# Patient Record
Sex: Female | Born: 1955 | Race: White | Hispanic: No | Marital: Married | Smoking: Never smoker
Health system: Southern US, Community
[De-identification: ages and names within clinical notes are randomized; demographics above are authoritative.]

## PROBLEM LIST (undated history)

## (undated) DIAGNOSIS — Z87442 Personal history of urinary calculi: Secondary | ICD-10-CM

## (undated) DIAGNOSIS — C9110 Chronic lymphocytic leukemia of B-cell type not having achieved remission: Secondary | ICD-10-CM

## (undated) DIAGNOSIS — D649 Anemia, unspecified: Secondary | ICD-10-CM

## (undated) DIAGNOSIS — I83893 Varicose veins of bilateral lower extremities with other complications: Secondary | ICD-10-CM

## (undated) DIAGNOSIS — M1990 Unspecified osteoarthritis, unspecified site: Secondary | ICD-10-CM

## (undated) DIAGNOSIS — N951 Menopausal and female climacteric states: Secondary | ICD-10-CM

## (undated) DIAGNOSIS — E119 Type 2 diabetes mellitus without complications: Secondary | ICD-10-CM

## (undated) DIAGNOSIS — E785 Hyperlipidemia, unspecified: Secondary | ICD-10-CM

## (undated) DIAGNOSIS — K219 Gastro-esophageal reflux disease without esophagitis: Secondary | ICD-10-CM

## (undated) DIAGNOSIS — R0789 Other chest pain: Secondary | ICD-10-CM

## (undated) DIAGNOSIS — I872 Venous insufficiency (chronic) (peripheral): Secondary | ICD-10-CM

## (undated) DIAGNOSIS — E079 Disorder of thyroid, unspecified: Secondary | ICD-10-CM

## (undated) DIAGNOSIS — Z9109 Other allergy status, other than to drugs and biological substances: Secondary | ICD-10-CM

## (undated) DIAGNOSIS — E669 Obesity, unspecified: Secondary | ICD-10-CM

## (undated) DIAGNOSIS — I10 Essential (primary) hypertension: Secondary | ICD-10-CM

## (undated) DIAGNOSIS — E063 Autoimmune thyroiditis: Secondary | ICD-10-CM

## (undated) DIAGNOSIS — E7800 Pure hypercholesterolemia, unspecified: Secondary | ICD-10-CM

## (undated) DIAGNOSIS — F419 Anxiety disorder, unspecified: Secondary | ICD-10-CM

## (undated) DIAGNOSIS — E039 Hypothyroidism, unspecified: Secondary | ICD-10-CM

## (undated) HISTORY — DX: Venous insufficiency (chronic) (peripheral): I87.2

## (undated) HISTORY — PX: DILATION AND CURETTAGE OF UTERUS: SHX78

## (undated) HISTORY — DX: Menopausal and female climacteric states: N95.1

## (undated) HISTORY — DX: Pure hypercholesterolemia, unspecified: E78.00

## (undated) HISTORY — DX: Hyperlipidemia, unspecified: E78.5

## (undated) HISTORY — DX: Varicose veins of bilateral lower extremities with other complications: I83.893

## (undated) HISTORY — DX: Disorder of thyroid, unspecified: E07.9

## (undated) HISTORY — DX: Chronic lymphocytic leukemia of B-cell type not having achieved remission: C91.10

## (undated) HISTORY — DX: Other allergy status, other than to drugs and biological substances: Z91.09

## (undated) HISTORY — DX: Autoimmune thyroiditis: E06.3

## (undated) HISTORY — DX: Obesity, unspecified: E66.9

## (undated) HISTORY — DX: Other chest pain: R07.89

---

## 1987-02-02 HISTORY — PX: VEIN LIGATION AND STRIPPING: SHX2653

## 1999-11-18 ENCOUNTER — Emergency Department (HOSPITAL_COMMUNITY): Admission: EM | Admit: 1999-11-18 | Discharge: 1999-11-18 | Payer: Self-pay | Admitting: Internal Medicine

## 2009-04-01 DIAGNOSIS — R0789 Other chest pain: Secondary | ICD-10-CM

## 2009-04-01 HISTORY — DX: Other chest pain: R07.89

## 2009-04-11 ENCOUNTER — Ambulatory Visit: Payer: Self-pay | Admitting: Cardiovascular Disease

## 2009-04-22 ENCOUNTER — Telehealth: Payer: Self-pay | Admitting: Cardiovascular Disease

## 2010-03-03 NOTE — Progress Notes (Signed)
  Phone Note Outgoing Call   Call placed by: Dessie Coma LPN Call placed to: Patient Summary of Call: LMVM-notified patient per Dr. Freida Busman, to f/u in 6-12 months unless chest pain reoccurs.  To call office in 6 months to make a f/u appt.

## 2010-04-15 ENCOUNTER — Encounter: Payer: Self-pay | Admitting: Cardiovascular Disease

## 2010-06-16 NOTE — Assessment & Plan Note (Signed)
[facility]                        [facility] OFFICE NOTE   NAME:Michele King, Michele King                            MRN:          [id number]  DATE:04/11/2009                            DOB:          Feb 27, 1955    CHIEF COMPLAINT:  Chest discomfort.   HISTORY OF PRESENT ILLNESS:  Michele King is a 55 year old white female past  medical history significant for hypothyroidism, hyperlipidemia who is  presenting with an episode of chest discomfort.  The patient states that  last night while at rest, she experienced acute onset of a substernal  chest tightness associated with some nausea lasting approximately 15  minutes and resolving.  This discomfort has not reoccurred.  The patient  states that for the past 6 months, she has had intermittent fleeting,  sharp right-sided chest discomfort not related to activity without  radiation or associated symptoms.  She has had that on and off during  the day today.  She states that discomfort tends to be reproducible by  palpation.  She is up-to-date with her mammography.  She denies any  dyspnea on exertion, lower extremity edema, PND, orthopnea, dizziness or  syncope.  She reported to Urgent Care this morning where per her report  chest x-ray was normal and EKG showed nonspecific T-wave abnormalities  and troponin was 0.01, and a CK was 86.   PAST MEDICAL HISTORY:  As above in HPI.  In addition, the patient has  reflux.   SOCIAL HISTORY:  No tobacco, no alcohol.   FAMILY HISTORY:  The patient states her father had a small heart  attack when he was around her age.  However, he did not have any  revascularization.   ALLERGIES:  No known drug allergies.   MEDICATIONS:  1. Simvastatin 20 mg daily.  2. Synthroid 0.125 mg daily.  3. Aciphex daily.   REVIEW OF SYSTEMS:  As in HPI.  The patient also endorses increased  stressors recently.  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  Other systems  are negative.   PHYSICAL EXAMINATION:  VITAL SIGNS:  Blood pressure is 118/66, pulse is  73, satting 96% on room air, and she weighs 197 pounds.  GENERAL:  No acute distress.  HEENT:  Normocephalic, atraumatic.  NECK:  Supple.  No carotid bruit.  No JVD.  HEART:  Regular rate and rhythm without murmur, rub or gallop.  LUNGS:  Clear bilaterally.  ABDOMEN:  Soft, nontender, nondistended.  EXTREMITIES:  Without edema.  SKIN:  Warm and dry.  NEURO:  Nonfocal.  MUSCULOSKELETAL:  The patient has tenderness to palpation over that  right aspect of the breast where the chest pain has been occurring over  the past 6 months.  PSYCHIATRIC:  The patient is appropriate with normal levels of insight.   I reviewed the patient's labs as above in HPI.  I reviewed the patient's  EKG.  The patient is normal sinus rhythm with some nonspecific T-wave  flattening, V2 and V3, otherwise within normal limits.   ASSESSMENT:  A 55 year old female with hyperlipidemia who is presenting  with 2 separate types of chest discomfort.  The chest discomfort she has  been experiencing over the past 6 months is likely musculoskeletal in  origin as it is reproducible and very atypical in nature.  The chest  tightness that she describes yesterday evening is more concerning for  angina.  It has not reoccurred.  We asked her she begin therapy with  aspirin 162 mg daily and she will be given a prescription for sublingual  nitroglycerin.  Over the weekend, the patient is told that if she has  repeated episodes of this chest discomfort especially if they are  frequent or not promptly relieved with NRT, she should report to the  emergency room.  We have scheduled an exercise Cardiolite for next  Wednesday as the patient states she has prior engagements through work  on Monday and Tuesday.  The patient is told that if the chest pain does  not reoccur between now and Wednesday that  is fine for her to wait until  then for the stress test.  If she does have recurrences in earlier  ischemia, evaluation will be needed.     Brayton El, MD  Electronically Signed    SGA/MedQ  DD: 04/11/2009  DT: 04/12/2009  Job #: [id number]

## 2010-06-16 NOTE — Letter (Signed)
April 11, 2009    Gillis Ends, MD  [facility]  [address]  [place], [place]  [address]   RE:  Michele King, Michele King  MRN:  [id number]  /  DOB:  05/13/1955   Dear Dr. Manson Passey:   I had the pleasure of seeing Michele King in Cardiology Clinic this  afternoon.  As you know, she is a 55 year old white female who had an  episode of chest discomfort yesterday evening whom you saw in Urgent  Care.  The patient has been having some atypical chest discomfort that  has been reproducible over the past 6 months.  However, this episode of  chest discomfort she had last night was clearly different and more  concerning for angina.  Her EKG shows some nonspecific changes and her  cardiac markers were all within normal limits.  I had an exercise  Cardiolyte study perfomred which was negative for inducible ishcemia.  The pain was likely noncardiac.   Thank you for the referral of this patient.  Please contact my office if  I can be of further assistance.    Sincerely,      Brayton El, MD  Electronically Signed    SGA/MedQ  DD: 04/11/2009  DT: 04/11/2009  Job #: [id number]

## 2010-07-28 ENCOUNTER — Encounter: Payer: Self-pay | Admitting: Cardiovascular Disease

## 2011-09-14 ENCOUNTER — Encounter: Payer: Self-pay | Admitting: Oncology

## 2011-09-14 ENCOUNTER — Telehealth: Payer: Self-pay | Admitting: Oncology

## 2011-09-14 ENCOUNTER — Telehealth: Payer: Self-pay | Admitting: *Deleted

## 2011-09-14 ENCOUNTER — Other Ambulatory Visit: Payer: Self-pay | Admitting: Oncology

## 2011-09-14 DIAGNOSIS — D72820 Lymphocytosis (symptomatic): Secondary | ICD-10-CM

## 2011-09-14 NOTE — Telephone Encounter (Signed)
Pt called and wants appt to be in AM , appt moved to morning, pt also transferred to financial advocate, per pt request

## 2011-09-14 NOTE — Telephone Encounter (Signed)
Referred by Elie Goody, NP Dx- Chronic High WBC. NP packet mailed out.

## 2011-09-14 NOTE — Progress Notes (Signed)
I did return the patient phone call this afternoon and she wanted to know what all does she need to bring tomorrow for her first doctor visit, and I told her to make sure she bring her insurance card.

## 2011-09-14 NOTE — Telephone Encounter (Signed)
Patient confirmed over the phone the new date and time on 09-15-2011 starting at 1:30pm with fcounseling

## 2011-09-15 ENCOUNTER — Encounter: Payer: Self-pay | Admitting: Oncology

## 2011-09-15 ENCOUNTER — Ambulatory Visit: Payer: BC Managed Care – PPO

## 2011-09-15 ENCOUNTER — Other Ambulatory Visit (HOSPITAL_BASED_OUTPATIENT_CLINIC_OR_DEPARTMENT_OTHER): Payer: BC Managed Care – PPO | Admitting: Lab

## 2011-09-15 ENCOUNTER — Telehealth: Payer: Self-pay | Admitting: Oncology

## 2011-09-15 ENCOUNTER — Other Ambulatory Visit (HOSPITAL_COMMUNITY)
Admission: RE | Admit: 2011-09-15 | Discharge: 2011-09-15 | Disposition: A | Discharge status: Home / Self Care | Payer: BC Managed Care – PPO | Source: Ambulatory Visit | Attending: Oncology | Admitting: Oncology

## 2011-09-15 ENCOUNTER — Ambulatory Visit (HOSPITAL_BASED_OUTPATIENT_CLINIC_OR_DEPARTMENT_OTHER): Payer: BC Managed Care – PPO | Admitting: Oncology

## 2011-09-15 DIAGNOSIS — D72820 Lymphocytosis (symptomatic): Secondary | ICD-10-CM

## 2011-09-15 DIAGNOSIS — D72829 Elevated white blood cell count, unspecified: Secondary | ICD-10-CM

## 2011-09-15 LAB — CBC WITH DIFFERENTIAL/PLATELET
BASO%: 0.4 % (ref 0.0–2.0)
EOS%: 0.8 % (ref 0.0–7.0)
Eosinophils Absolute: 0.1 10*3/uL (ref 0.0–0.5)
MCH: 27.2 pg (ref 25.1–34.0)
MCHC: 32.7 g/dL (ref 31.5–36.0)
MCV: 83.2 fL (ref 79.5–101.0)
MONO%: 4.5 % (ref 0.0–14.0)
NEUT#: 4.1 10*3/uL (ref 1.5–6.5)
RBC: 4.82 10*6/uL (ref 3.70–5.45)
RDW: 15.5 % — ABNORMAL HIGH (ref 11.2–14.5)

## 2011-09-15 LAB — COMPREHENSIVE METABOLIC PANEL
ALT: 17 U/L (ref 0–35)
AST: 14 U/L (ref 0–37)
Albumin: 4.3 g/dL (ref 3.5–5.2)
Alkaline Phosphatase: 59 U/L (ref 39–117)
Potassium: 4.2 mEq/L (ref 3.5–5.3)
Sodium: 139 mEq/L (ref 135–145)
Total Bilirubin: 0.4 mg/dL (ref 0.3–1.2)
Total Protein: 6.3 g/dL (ref 6.0–8.3)

## 2011-09-15 NOTE — Telephone Encounter (Signed)
gve the pt her oct 2013 appt calendar °

## 2011-09-15 NOTE — Progress Notes (Signed)
Note dictated

## 2011-09-15 NOTE — Progress Notes (Signed)
Patient came in today as a new patient and she has one insurance,she said she should be [place] as far as financial assistance.

## 2011-09-16 NOTE — Progress Notes (Signed)
CC:   Althea Charon, MD, Fax [phone]  REASON FOR CONSULTATION:  Lymphocytosis.  HISTORY OF PRESENT ILLNESS:  Mrs. Harbeck is a very pleasant, 56 year old woman, native of [place] [facility] [place], currently of [place].  She is a pleasant woman with past medical history of chronic sinusitis as well as hypothyroidism.  She has recently established care at the [facility].  As part of her evaluation, she had a CBC done on July the 11th and showed her total white cell counts show 15.6, her hemoglobin was 13, platelet count was 269.  Her lymphocyte percentage was 68%, neutrophil percentage was 26.  The absolute lymphocyte at that time was 10.6.  A repeat CBC on July 18 showed her white cell count was 14.6 with a lymphocyte percentage up to 71% with an absolute lymphocyte count of 10.2.  For that reason, the patient was referred to me for evaluation for lymphocytosis.  The rest of her laboratory data was relatively unremarkable.  She had a negative ANA.  She did have a positive anti-thyroglobulin antibody.  She had a normal LDH, normal ferritin, normal chemistry, BUN and creatinine.  Clinically she is feeling relatively fair.  Her thyroid supplements have been switched to Armour Thyroid and she has been doing well with that.  She has had some issues of fatigue and tiredness in the past.  She also had some sinusitis but nothing recently.  She was treated with antibiotics briefly back in June but that did not really affect her white cell count.  REVIEW OF SYSTEMS:  Does not report any headaches, blurry vision, double vision.  She does not report any motor or sensory neuropathy.  She did not report any alteration in mental status.  She did not report any psychiatric issues, depression.  She did not report any fever, chills, sweats.  She did not report any cough, hemoptysis, hematemesis.  No nausea or vomiting.  Denied any abdominal pain.  No hematochezia, melena, genitourinary complaints.   Rest of review of systems is unremarkable.  PAST MEDICAL HISTORY:  Significant for chronic sinusitis, history of hypothyroidism.  She denies any history of hypertension, diabetes.  She has had vein stripping in the past.  MEDICATION:  She is on AcipHex.  She is on Armour Thyroid and she is on naltrexone 50 mg daily.  ALLERGIES:  None.  SOCIAL HISTORY:  She is married.  She has 2 children.  She is a Engineer, site.  Denied any alcohol or tobacco abuse.  FAMILY HISTORY:  Her father had coronary disease.  Her mother had thyroid issues and rheumatoid arthritis.  Sister also had thyroid disease.  PHYSICAL EXAM:  Alert, awake, pleasant woman, did not appear in any distress today.  Her ECOG performance status is zero.  Blood pressure is 105/64, pulse 76, weight 183.6 pounds.  HEENT:  Head is normocephalic, atraumatic.  Pupils equal, round, reactive to light.  Neck:  Supple.  No lymphadenopathy.  Oral mucosa is moist and pink.  Heart:  Regular rate and rhythm.  S1 and S2.  Lungs:  Clear to auscultation.  Abdomen:  Soft, nontender.  I could not appreciate any splenomegaly.  Extremities:  Had no edema.  Lymphatic:  I could not appreciate any lymphadenopathy.  LABORATORY DATA:  Today showed a hemoglobin of 13.1, white cell count of 14.3 with a lymphocyte percentage of 65.9, absolute lymphocyte count of 9400.  Peripheral smear was personally reviewed today and showed clear evidence of lymphocytosis on the smear, some variant lymphocytes were noted.  I could not appreciate any dysplasia or dysplastic-looking cells.  No evidence of any schistocytosis, red cell fragmentation or platelet clumping.  ASSESSMENT AND PLAN:  A 56 year old woman with the following issues: Lymphocytosis with an absolute lymphocyte count at least 9400 and on today's visit.  Lymphocyte percentages have ranged between 65% to 70%, lower limit of normal is 149.  Differential diagnosis discussed today with Mrs. Balthazar.   Reactive lymphocytosis is a possibility.  I think it is a less likely situation given the fact she had persistent lymphocytosis despite being treated with antibiotics.  Autoimmune phenomenon related to Hashimoto's thyroiditis certainly can cause lymphocytosis at this time but, in all likelihood, she could also have possible primary condition causing her lymphocytosis.  The differential diagnosis for that were discussed, lymphoproliferative disorder, most commonly chronic lymphocytic leukemia or CLL is a distinct possibility.  A variation of that would be prolymphocytic leukemia.  Other lymphoproliferative disorders such as non-Hodgkin lymphoma, she could have more of a peripheral lymphocytosis related to that.  To work this up I think the easiest thing to do is to send her blood for flow cytometry and, depending on these findings, imaging studies, plus or minus a bone marrow biopsy would be pursued.  Overall, she is asymptomatic and I do not think that she has a more aggressive form of lymphoma.  I think, if anything, probably has an indolent lymphoproliferative disorder such as CLL.  All her questions were answered today.  I will have her come back in a quick followup and recheck her blood in about 2 months.    ______________________________ Benjiman Core, M.D. FNS/MEDQ  D:  09/15/2011  T:  09/15/2011  Job:  [id number]

## 2011-09-27 ENCOUNTER — Telehealth: Payer: Self-pay | Admitting: *Deleted

## 2011-09-27 NOTE — Telephone Encounter (Signed)
Pt called wanting to know results of labs done  09/15/11.   Informed pt that md was not in office.  Message will be relayed to Belenda Cruise, NP on 09/28/11.   Pt voiced understanding.

## 2011-11-16 ENCOUNTER — Ambulatory Visit: Payer: BC Managed Care – PPO | Admitting: Oncology

## 2011-11-17 ENCOUNTER — Other Ambulatory Visit (HOSPITAL_BASED_OUTPATIENT_CLINIC_OR_DEPARTMENT_OTHER): Payer: BC Managed Care – PPO | Admitting: Lab

## 2011-11-17 ENCOUNTER — Ambulatory Visit (HOSPITAL_BASED_OUTPATIENT_CLINIC_OR_DEPARTMENT_OTHER): Payer: BC Managed Care – PPO | Admitting: Oncology

## 2011-11-17 VITALS — BP 125/71 | HR 97 | Temp 97.0°F | Resp 20 | Wt 186.0 lb

## 2011-11-17 DIAGNOSIS — D72820 Lymphocytosis (symptomatic): Secondary | ICD-10-CM

## 2011-11-17 DIAGNOSIS — C9110 Chronic lymphocytic leukemia of B-cell type not having achieved remission: Secondary | ICD-10-CM

## 2011-11-17 LAB — CBC WITH DIFFERENTIAL/PLATELET
Eosinophils Absolute: 0.2 10*3/uL (ref 0.0–0.5)
HCT: 38.6 % (ref 34.8–46.6)
LYMPH%: 67.3 % — ABNORMAL HIGH (ref 14.0–49.7)
MONO#: 0.7 10*3/uL (ref 0.1–0.9)
NEUT#: 6 10*3/uL (ref 1.5–6.5)
NEUT%: 28.4 % — ABNORMAL LOW (ref 38.4–76.8)
Platelets: 229 10*3/uL (ref 145–400)
WBC: 21.1 10*3/uL — ABNORMAL HIGH (ref 3.9–10.3)

## 2011-11-17 LAB — COMPREHENSIVE METABOLIC PANEL (CC13)
ALT: 16 U/L (ref 0–55)
Albumin: 3.9 g/dL (ref 3.5–5.0)
CO2: 25 mEq/L (ref 22–29)
Calcium: 9.5 mg/dL (ref 8.4–10.4)
Chloride: 103 mEq/L (ref 98–107)
Creatinine: 0.7 mg/dL (ref 0.6–1.1)
Potassium: 3.9 mEq/L (ref 3.5–5.1)

## 2011-11-17 LAB — TECHNOLOGIST REVIEW

## 2011-11-17 LAB — CHCC SMEAR

## 2011-11-17 NOTE — Progress Notes (Signed)
Hematology and Oncology Follow Up Visit  Michele King [id number] [birth year] 56 y.o. 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  Current therapy: Observation only.   Interim History: [name] presents today for a follow up visit. She is a nice women I saw on 09/15/2011 for lymphocytosis. Her blood flow cytometry did confirm that it was indeed CLL. She continued to be asymptomatic at this time. Clinically she is feeling relatively well. No fevers or chills no recent infections or hospitalizations. She continue to perform activity of daily livings without any hindrance or decline.   Medications: I have reviewed the patient's current medications. Current outpatient prescriptions:naltrexone (DEPADE) 50 MG tablet, Take 4 mg by mouth daily. , Disp: , Rfl: ;  RABEprazole (ACIPHEX) 20 MG tablet, Take 20 mg by mouth daily.  , Disp: , Rfl: ;  thyroid (ARMOUR) 120 MG tablet, Take by mouth daily. Unknown dose, Disp: , Rfl:   Allergies: No Known Allergies  Past Medical History, Surgical history, Social history, and Family History were reviewed and updated.  Review of Systems: Constitutional:  Negative for fever, chills, night sweats, anorexia, weight loss, pain. Cardiovascular: no chest pain or dyspnea on exertion Respiratory: negative Neurological: no TIA or stroke symptoms Dermatological: negative ENT: negative.  Skin: Negative. Gastrointestinal: no abdominal pain, change in bowel habits, or black or bloody stools Genito-Urinary: negative Hematological and Lymphatic: negative Breast: negative Musculoskeletal: negative Remaining ROS negative. Physical Exam: Blood pressure 125/71, pulse 97, temperature 97 F (36.1 C), temperature source Oral, resp. rate 20, weight 186 lb (84.369 kg). ECOG:  General appearance: alert Head: Normocephalic, without obvious abnormality,  atraumatic Neck: no adenopathy, no carotid bruit, no JVD, supple, symmetrical, trachea midline and thyroid not enlarged, symmetric, no tenderness/mass/nodules Lymph nodes: Cervical, supraclavicular, and axillary nodes normal. Heart:regular rate and rhythm, S1, S2 normal, no murmur, click, rub or gallop Lung:chest clear, no wheezing, rales, normal symmetric air entry Abdomin: soft, non-tender, without masses or organomegaly EXT:no erythema, induration, or nodules   Lab Results: Lab Results  Component Value Date   WBC 21.1* 11/17/2011   HGB 12.5 11/17/2011   HCT 38.6 11/17/2011   MCV 85.5 11/17/2011   PLT 229 11/17/2011     Chemistry      Component Value Date/Time   NA 138 11/17/2011 1543   NA 139 09/15/2011 1208   K 3.9 11/17/2011 1543   K 4.2 09/15/2011 1208   CL 103 11/17/2011 1543   CL 108 09/15/2011 1208   CO2 25 11/17/2011 1543   CO2 26 09/15/2011 1208   BUN 21.0 11/17/2011 1543   BUN 15 09/15/2011 1208   CREATININE 0.7 11/17/2011 1543   CREATININE 0.52 09/15/2011 1208      Component Value Date/Time   CALCIUM 9.5 11/17/2011 1543   CALCIUM 9.3 09/15/2011 1208   ALKPHOS 69 11/17/2011 1543   ALKPHOS 59 09/15/2011 1208   AST 12 11/17/2011 1543   AST 14 09/15/2011 1208   ALT 16 11/17/2011 1543   ALT 17 09/15/2011 1208   BILITOT 0.30 11/17/2011 1543   BILITOT 0.4 09/15/2011 1208      Impression and Plan:  56 year old woman with the following issues:  1. Lymphocytosis. This appears to be due to early stage CLL. She is asymptomatic at this point. I discussed with her today the natural history of CLL, the treatment options as well as possible complications (immune dysregulation, immune  cytopenias, fevers, chills and weight loss). At this point, we will continue with active surveillance at this time and repeat counts in 4 month. If she develops rapid increase her counts, I will obtain a CT scan at this time to assess lymphadenopathy.      Eli Hose, MD 10/16/20134:21 PM

## 2015-04-15 DIAGNOSIS — C9110 Chronic lymphocytic leukemia of B-cell type not having achieved remission: Secondary | ICD-10-CM | POA: Diagnosis not present

## 2015-08-08 DIAGNOSIS — C9110 Chronic lymphocytic leukemia of B-cell type not having achieved remission: Secondary | ICD-10-CM

## 2015-12-12 DIAGNOSIS — C9110 Chronic lymphocytic leukemia of B-cell type not having achieved remission: Secondary | ICD-10-CM | POA: Diagnosis not present

## 2016-04-13 DIAGNOSIS — C9110 Chronic lymphocytic leukemia of B-cell type not having achieved remission: Secondary | ICD-10-CM | POA: Diagnosis not present

## 2016-08-30 DIAGNOSIS — C9110 Chronic lymphocytic leukemia of B-cell type not having achieved remission: Secondary | ICD-10-CM | POA: Diagnosis not present

## 2017-01-06 DIAGNOSIS — C9110 Chronic lymphocytic leukemia of B-cell type not having achieved remission: Secondary | ICD-10-CM | POA: Diagnosis not present

## 2017-04-19 ENCOUNTER — Encounter: Payer: Self-pay | Admitting: Gastroenterology

## 2017-05-06 DIAGNOSIS — C9110 Chronic lymphocytic leukemia of B-cell type not having achieved remission: Secondary | ICD-10-CM | POA: Diagnosis not present

## 2017-11-07 DIAGNOSIS — C9110 Chronic lymphocytic leukemia of B-cell type not having achieved remission: Secondary | ICD-10-CM | POA: Diagnosis not present

## 2018-07-10 DIAGNOSIS — C9110 Chronic lymphocytic leukemia of B-cell type not having achieved remission: Secondary | ICD-10-CM

## 2019-01-15 DIAGNOSIS — C9110 Chronic lymphocytic leukemia of B-cell type not having achieved remission: Secondary | ICD-10-CM

## 2019-05-21 DIAGNOSIS — Z1159 Encounter for screening for other viral diseases: Secondary | ICD-10-CM

## 2019-05-21 DIAGNOSIS — K8010 Calculus of gallbladder with chronic cholecystitis without obstruction: Secondary | ICD-10-CM

## 2019-05-21 DIAGNOSIS — R1011 Right upper quadrant pain: Secondary | ICD-10-CM

## 2019-05-21 DIAGNOSIS — Z1211 Encounter for screening for malignant neoplasm of colon: Secondary | ICD-10-CM

## 2019-05-21 DIAGNOSIS — Z6834 Body mass index (BMI) 34.0-34.9, adult: Secondary | ICD-10-CM

## 2019-05-21 HISTORY — DX: Right upper quadrant pain: R10.11

## 2019-05-21 HISTORY — DX: Body mass index (BMI) 34.0-34.9, adult: Z68.34

## 2019-05-21 HISTORY — DX: Encounter for screening for other viral diseases: Z11.59

## 2019-05-21 HISTORY — DX: Calculus of gallbladder with chronic cholecystitis without obstruction: K80.10

## 2019-05-21 HISTORY — DX: Encounter for screening for malignant neoplasm of colon: Z12.11

## 2019-06-02 HISTORY — PX: CHOLECYSTECTOMY: SHX55

## 2019-06-19 DIAGNOSIS — Z09 Encounter for follow-up examination after completed treatment for conditions other than malignant neoplasm: Secondary | ICD-10-CM

## 2019-06-19 HISTORY — DX: Encounter for follow-up examination after completed treatment for conditions other than malignant neoplasm: Z09

## 2019-11-08 DIAGNOSIS — E7800 Pure hypercholesterolemia, unspecified: Secondary | ICD-10-CM | POA: Insufficient documentation

## 2019-11-08 DIAGNOSIS — C9110 Chronic lymphocytic leukemia of B-cell type not having achieved remission: Secondary | ICD-10-CM | POA: Insufficient documentation

## 2019-11-08 DIAGNOSIS — E079 Disorder of thyroid, unspecified: Secondary | ICD-10-CM | POA: Insufficient documentation

## 2019-11-08 DIAGNOSIS — E669 Obesity, unspecified: Secondary | ICD-10-CM | POA: Insufficient documentation

## 2019-11-08 DIAGNOSIS — E063 Autoimmune thyroiditis: Secondary | ICD-10-CM | POA: Insufficient documentation

## 2019-11-08 DIAGNOSIS — N951 Menopausal and female climacteric states: Secondary | ICD-10-CM | POA: Insufficient documentation

## 2019-11-08 DIAGNOSIS — I872 Venous insufficiency (chronic) (peripheral): Secondary | ICD-10-CM | POA: Insufficient documentation

## 2019-11-08 DIAGNOSIS — E785 Hyperlipidemia, unspecified: Secondary | ICD-10-CM | POA: Insufficient documentation

## 2019-11-08 DIAGNOSIS — Z9109 Other allergy status, other than to drugs and biological substances: Secondary | ICD-10-CM | POA: Insufficient documentation

## 2019-11-08 DIAGNOSIS — I83893 Varicose veins of bilateral lower extremities with other complications: Secondary | ICD-10-CM | POA: Insufficient documentation

## 2019-11-09 ENCOUNTER — Other Ambulatory Visit: Payer: Self-pay

## 2019-11-09 ENCOUNTER — Ambulatory Visit: Payer: BC Managed Care – PPO | Admitting: Cardiology

## 2019-11-09 ENCOUNTER — Encounter: Payer: Self-pay | Admitting: Cardiology

## 2019-11-09 VITALS — BP 170/88 | HR 67 | Ht 62.0 in | Wt 171.2 lb

## 2019-11-09 DIAGNOSIS — E782 Mixed hyperlipidemia: Secondary | ICD-10-CM | POA: Diagnosis not present

## 2019-11-09 DIAGNOSIS — E669 Obesity, unspecified: Secondary | ICD-10-CM

## 2019-11-09 DIAGNOSIS — R0789 Other chest pain: Secondary | ICD-10-CM

## 2019-11-09 DIAGNOSIS — E66811 Obesity, class 1: Secondary | ICD-10-CM

## 2019-11-09 HISTORY — DX: Obesity, unspecified: E66.9

## 2019-11-09 HISTORY — DX: Obesity, class 1: E66.811

## 2019-11-09 MED ORDER — NITROGLYCERIN 0.4 MG SL SUBL: 0.4000 mg | SUBLINGUAL_TABLET | SUBLINGUAL | 3 refills | Status: AC | PRN

## 2019-11-09 NOTE — Patient Instructions (Signed)
Medication Instructions:  Your physician has recommended you make the following change in your medication:  START: Nitroglycerin 0.4 mg take one tablet by mouth every 5 minutes up to three times as needed for chest pain.   *If you need a refill on your cardiac medications before your next appointment, please call your pharmacy*   Lab Work: None If you have labs (blood work) drawn today and your tests are completely normal, you will receive your results only by: Marland Kitchen MyChart Message (if you have MyChart) OR . A paper copy in the mail If you have any lab test that is abnormal or we need to change your treatment, we will call you to review the results.   Testing/Procedures:  We have put in the order for you to have a CT calcium score completed. They will call you to schedule this appointment.    [facility] [address] [place], [place] [address] Phone:  [phone]    Please arrive 15 minutes prior to your appointment time for registration and insurance purposes.  The test will take approximately 3 to 4 hours to complete; you may bring reading material.  If someone comes with you to your appointment, they will need to remain in the main lobby due to limited space in the testing area. **If you are pregnant or breastfeeding, please notify the nuclear lab prior to your appointment**  How to prepare for your Myocardial Perfusion Test: . Do not eat or drink 3 hours prior to your test, except you may have water. . Do not consume products containing caffeine (regular or decaffeinated) 12 hours prior to your test. (ex: coffee, chocolate, sodas, tea). . Do bring a list of your current medications with you.  If not listed below, you may take your medications as normal. . HOLD diabetic medication/insulin the morning of the test: Metformin . Do wear comfortable clothes (no dresses or overalls) and walking shoes, tennis shoes preferred (No heels or open toe shoes are  allowed). . Do NOT wear cologne, perfume, aftershave, or lotions (deodorant is allowed). . If these instructions are not followed, your test will have to be rescheduled.  Please report to [address] for your test.  If you have questions or concerns about your appointment, you can call the [facility] at [phone].  If you cannot keep your appointment, please provide 24 hours notification to the Nuclear Lab, to avoid a possible $50 charge to your account.   Follow-Up: At [facility], you and your health needs are our priority.  As part of our continuing mission to provide you with exceptional heart care, we have created designated Provider Care Teams.  These Care Teams include your primary Cardiologist (physician) and Advanced Practice Providers (APPs -  Physician Assistants and Nurse Practitioners) who all work together to provide you with the care you need, when you need it.  We recommend signing up for the patient portal called "MyChart".  Sign up information is provided on this After Visit Summary.  MyChart is used to connect with patients for Virtual Visits (Telemedicine).  Patients are able to view lab/test results, encounter notes, upcoming appointments, etc.  Non-urgent messages can be sent to your provider as well.   To learn more about what you can do with MyChart, go to [email or web address].    Your next appointment:   1 month(s)  The format for your next appointment:   In Person  Provider:   Sunny Schlein  Revankar, MD   Other Instructions

## 2019-11-09 NOTE — Progress Notes (Signed)
Cardiology Office Note:    Date:  42/08/621   ID:  Michele King, DOB [birth year], MRN [id number]  PCP:  Lind Guest, NP  Cardiologist:  Jenean Lindau, MD   Referring MD: Lind Guest, NP    ASSESSMENT:    1. Mixed hyperlipidemia   2. Chest discomfort   3. Obesity (BMI 30.0-34.9)    PLAN:    In order of problems listed above:  1. Primary prevention stressed with the patient.  Importance of compliance with diet medication stressed and she vocalized understanding. 2. Chest discomfort: Patient has multiple risk factors for coronary artery disease.  Following recommendations were made.  Sublingual nitroglycerin prescription was sent, its protocol and 911 protocol explained and the patient vocalized understanding questions were answered to the patient's satisfaction.  Lexiscan sestamibi would be set for and patient is agreeable. 3. Essential hypertension: This is a element of whitecoat hypertension.  Her blood pressures at home are fine she will continue to monitor them. 4. Obesity: Diet was emphasized and she plans to do better.  She is already lost more than 30 pounds in the past year. 5. Mixed dyslipidemia: Patient takes statins very sporadically.  I think getting a CT calcium score will help assess and address the situation to make her more focused on lipid lowering.  She is agreeable.  I will set this. 6. Patient will be seen in follow-up appointment in 1 month or earlier if the patient has any concerns..  She knows to go to the nearest emergency room for any concerning symptoms.   Medication Adjustments/Labs and Tests Ordered: Current medicines are reviewed at length with the patient today.  Concerns regarding medicines are outlined above.  Orders Placed This Encounter  Procedures  . CT CARDIAC SCORING  . MYOCARDIAL PERFUSION IMAGING   Meds ordered this encounter  Medications  . nitroGLYCERIN (NITROSTAT) 0.4 MG SL tablet    Sig: Place 1 tablet (0.4 mg total)  under the tongue every 5 (five) minutes as needed for chest pain.    Dispense:  90 tablet    Refill:  3     History of Present Illness:    Michele King is a 64 y.o. female who is being seen today for the evaluation of chest discomfort at the request of Lind Guest, NP.  Patient is a pleasant 64 year old female.  She has past medical history of mixed dyslipidemia.  She has whitecoat hypertension.  She tells me that her blood pressures are fine at home.  She mentions to me that she has been having chest discomfort like squeezing sensation in the chest at times.  No radiation to the neck or to the arms.  Then there are times when she can walk a mile without any problem.  This has been of concern to her and therefore she is referred here for an evaluation.  At the time of my evaluation, the patient is alert awake oriented and in no distress.  Past Medical History:  Diagnosis Date  . BMI 34.0-34.9,adult 05/21/2019  . Calculus of gallbladder with chronic cholecystitis without obstruction 05/21/2019  . Chest discomfort 3/11  . 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 Elevated cholesterol   . Encounter for screening colonoscopy 05/21/2019  . Environmental allergies   . Hashimoto's disease   . Hyperlipidemia   . Obesity   . Postoperative examination 06/19/2019  . RUQ pain 05/21/2019  . Special screening examination for viral disease 05/21/2019  . Symptomatic  menopausal or female climacteric states   . Thyroid disease    hypothyroidism  . Varicose veins of bilateral lower extremities with other complications   . Venous insufficiency of both lower extremities     Past Surgical History:  Procedure Laterality Date  . CHOLECYSTECTOMY  06/2019  . DILATION AND CURETTAGE OF UTERUS    . VEIN LIGATION AND STRIPPING  1989    Current Medications: Current Meds  Medication Sig  . cetirizine (ZYRTEC) 10 MG tablet Take 10 mg by mouth as needed for allergies.  Marland Kitchen ergocalciferol (VITAMIN D2) 1.25  MG (50000 UT) capsule Take 50,000 Units by mouth once a week.  . Estriol 10 % CREA Take 2 mg by mouth daily.  . fluticasone (FLONASE) 50 MCG/ACT nasal spray Place 2 sprays into both nostrils as needed for allergies or rhinitis.  . metFORMIN (GLUCOPHAGE) 500 MG tablet Take 500 mg by mouth 2 (two) times daily.  . naltrexone (DEPADE) 50 MG tablet Take 4 mg by mouth daily.   . pravastatin (PRAVACHOL) 10 MG tablet Take 10 mg by mouth 3 (three) times a week.  . progesterone 50 MG/ML injection Take 50 mg by mouth daily.  . RABEprazole (ACIPHEX) 20 MG tablet Take 20 mg by mouth daily.    Marland Kitchen thyroid (ARMOUR) 120 MG tablet Take by mouth daily. Unknown dose     Allergies:   Patient has no known allergies.   Social History   Socioeconomic History  . Marital status: Married    Spouse name: Not on file  . Number of children: Not on file  . Years of education: Not on file  . Highest education level: Not on file  Occupational History  . Not on file  Tobacco Use  . Smoking status: Never Smoker  . Smokeless tobacco: Never Used  Vaping Use  . Vaping Use: Never used  Substance and Sexual Activity  . Alcohol use: Never  . Drug use: Never  . Sexual activity: Not on file  Other Topics Concern  . Not on file  Social History Narrative  . Not on file   Social Determinants of Health   Financial Resource Strain:   . Difficulty of Paying Living Expenses: Not on file  Food Insecurity:   . Worried About Charity fundraiser in the Last Year: Not on file  . Ran Out of Food in the Last Year: Not on file  Transportation Needs:   . Lack of Transportation (Medical): Not on file  . Lack of Transportation (Non-Medical): Not on file  Physical Activity:   . Days of Exercise per Week: Not on file  . Minutes of Exercise per Session: Not on file  Stress:   . Feeling of Stress : Not on file  Social Connections:   . Frequency of Communication with Friends and Family: Not on file  . Frequency of Social  Gatherings with Friends and Family: Not on file  . Attends Religious Services: Not on file  . Active Member of Clubs or Organizations: Not on file  . Attends Archivist Meetings: Not on file  . Marital Status: Not on file     Family History: The patient's family history includes Arthritis in her father and mother; Cancer in her mother; Heart attack in her father; Mitral valve prolapse in her sister.  ROS:   Please see the history of present illness.    All other systems reviewed and are negative.  EKGs/Labs/Other Studies Reviewed:    The following  studies were reviewed today: EKG reveals sinus rhythm and nonspecific ST-T changes   Recent Labs: No results found for requested labs within last 8760 hours.  Recent Lipid Panel No results found for: CHOL, TRIG, HDL, CHOLHDL, VLDL, LDLCALC, LDLDIRECT  Physical Exam:    VS:  BP (!) 170/88 (BP Location: Right Arm, Patient Position: Sitting, Cuff Size: Normal)   Pulse 67   Ht 5\' 2"  (1.575 m)   Wt 171 lb 3.2 oz (77.7 kg)   SpO2 99%   BMI 31.31 kg/m     Wt Readings from Last 3 Encounters:  11/09/19 171 lb 3.2 oz (77.7 kg)  11/17/11 186 lb (84.4 kg)     GEN: Patient is in no acute distress HEENT: Normal NECK: No JVD; No carotid bruits LYMPHATICS: No lymphadenopathy CARDIAC: S1 S2 regular, 2/6 systolic murmur at the apex. RESPIRATORY:  Clear to auscultation without rales, wheezing or rhonchi  ABDOMEN: Soft, non-tender, non-distended MUSCULOSKELETAL:  No edema; No deformity  SKIN: Warm and dry NEUROLOGIC:  Alert and oriented x 3 PSYCHIATRIC:  Normal affect    Signed, Jenean Lindau, MD  11/09/2019 10:15 AM    [place]

## 2019-11-09 NOTE — Addendum Note (Signed)
Addended by: Gar Ponto on: 11/09/2019 03:58 PM   Modules accepted: Orders

## 2019-11-21 ENCOUNTER — Telehealth: Payer: Self-pay | Admitting: *Deleted

## 2019-11-21 NOTE — Telephone Encounter (Signed)
Left message on voicemail in reference to upcoming appointment scheduled for 11/27/2019. Phone number given for a call back so details instructions can be given. Michele King, [name] No mychart available

## 2019-11-21 NOTE — Telephone Encounter (Signed)
Patient returned our phone call.Patient given detailed instructions per Myocardial Perfusion Study Information Sheet for the test on 11/27/2019 at 0800. Patient notified to arrive 15 minutes early and that it is imperative to arrive on time for appointment to keep from having the test rescheduled.  If you need to cancel or reschedule your appointment, please call the office within 24 hours of your appointment. . Patient verbalized understanding.Sherine Cortese, [name] No mychart

## 2019-11-26 ENCOUNTER — Other Ambulatory Visit: Payer: Self-pay

## 2019-11-26 ENCOUNTER — Telehealth: Payer: Self-pay

## 2019-11-26 ENCOUNTER — Ambulatory Visit (INDEPENDENT_AMBULATORY_CARE_PROVIDER_SITE_OTHER)
Admission: RE | Admit: 2019-11-26 | Discharge: 2019-11-26 | Disposition: A | Discharge status: Home / Self Care | Payer: Self-pay | Source: Ambulatory Visit | Attending: Cardiology | Admitting: Cardiology

## 2019-11-26 ENCOUNTER — Ambulatory Visit: Payer: BC Managed Care – PPO | Admitting: Cardiology

## 2019-11-26 DIAGNOSIS — R0789 Other chest pain: Secondary | ICD-10-CM

## 2019-11-26 NOTE — Telephone Encounter (Signed)
Patient had a missed phone call, I was in chart looking for a reason for the call.

## 2019-11-26 NOTE — Telephone Encounter (Signed)
Your physician has requested that you have a lexiscan myoview. For further information please visit [email or web address]. Please follow instruction sheet, as given.  The test will take approximately 3 to 4 hours to complete; you may bring reading material.  If someone comes with you to your appointment, they will need to remain in the main lobby due to limited space in the testing area.   How to prepare for your Myocardial Perfusion Test:  Do not eat or drink 3 hours prior to your test, except you may have water.  Do not consume products containing caffeine (regular or decaffeinated) 12 hours prior to your test. (ex: coffee, chocolate, sodas, tea).  Do bring a list of your current medications with you.  If not listed below, you may take your medications as normal.  Do wear comfortable clothes (no dresses or overalls) and walking shoes, tennis shoes preferred (No heels or open toe shoes are allowed).  Do NOT wear cologne, perfume, aftershave, or lotions (deodorant is allowed).  If these instructions are not followed, your test will have to be rescheduled.  Instructions reviewed. Pt had no additional questions and verbalized understanding.

## 2019-11-26 NOTE — Telephone Encounter (Signed)
Patient is requesting a phone call for someone to go over the instructions she needs to follow prior to her test tomorrow 11/27/2019 at 8 am.

## 2019-11-27 ENCOUNTER — Ambulatory Visit (INDEPENDENT_AMBULATORY_CARE_PROVIDER_SITE_OTHER): Payer: BC Managed Care – PPO

## 2019-11-27 DIAGNOSIS — R0789 Other chest pain: Secondary | ICD-10-CM | POA: Diagnosis not present

## 2019-11-27 LAB — MYOCARDIAL PERFUSION IMAGING
LV dias vol: 87 mL (ref 46–106)
LV sys vol: 32 mL
Peak HR: 107 {beats}/min
Rest HR: 63 {beats}/min
SDS: 2
SRS: 2
SSS: 4
TID: 1.06

## 2019-11-27 MED ORDER — REGADENOSON 0.4 MG/5ML IV SOLN
0.4000 mg | Freq: Once | INTRAVENOUS | Status: AC
  Administered 2019-11-27: 0.4 mg via INTRAVENOUS

## 2019-11-27 MED ORDER — TECHNETIUM TC 99M TETROFOSMIN IV KIT
9.1000 | PACK | Freq: Once | INTRAVENOUS | Status: AC | PRN
  Administered 2019-11-27: 9.1 via INTRAVENOUS

## 2019-11-27 MED ORDER — TECHNETIUM TC 99M TETROFOSMIN IV KIT
30.8000 | PACK | Freq: Once | INTRAVENOUS | Status: AC | PRN
  Administered 2019-11-27: 30.8 via INTRAVENOUS

## 2019-11-28 NOTE — Addendum Note (Signed)
Addended by: Truddie Hidden on: 11/28/2019 02:16 PM   Modules accepted: Orders

## 2019-11-29 LAB — HEPATIC FUNCTION PANEL
ALT: 8 IU/L (ref 0–32)
AST: 14 IU/L (ref 0–40)
Albumin: 4.5 g/dL (ref 3.8–4.8)
Alkaline Phosphatase: 107 IU/L (ref 44–121)
Bilirubin Total: 0.6 mg/dL (ref 0.0–1.2)
Bilirubin, Direct: 0.13 mg/dL (ref 0.00–0.40)
Total Protein: 6.2 g/dL (ref 6.0–8.5)

## 2019-11-29 LAB — BASIC METABOLIC PANEL
BUN/Creatinine Ratio: 12 (ref 12–28)
BUN: 9 mg/dL (ref 8–27)
CO2: 26 mmol/L (ref 20–29)
Calcium: 9.3 mg/dL (ref 8.7–10.3)
Chloride: 102 mmol/L (ref 96–106)
Creatinine, Ser: 0.76 mg/dL (ref 0.57–1.00)
GFR calc Af Amer: 96 mL/min/{1.73_m2} (ref >59)
GFR calc non Af Amer: 83 mL/min/{1.73_m2} (ref >59)
Glucose: 88 mg/dL (ref 65–99)
Potassium: 4 mmol/L (ref 3.5–5.2)
Sodium: 141 mmol/L (ref 134–144)

## 2019-11-29 LAB — LIPID PANEL
Chol/HDL Ratio: 4.1 ratio (ref 0.0–4.4)
Cholesterol, Total: 235 mg/dL — ABNORMAL HIGH (ref 100–199)
HDL: 57 mg/dL (ref >39)
LDL Chol Calc (NIH): 153 mg/dL — ABNORMAL HIGH (ref 0–99)
Triglycerides: 139 mg/dL (ref 0–149)
VLDL Cholesterol Cal: 25 mg/dL (ref 5–40)

## 2019-11-30 MED ORDER — ROSUVASTATIN CALCIUM 10 MG PO TABS: 10.0000 mg | ORAL_TABLET | Freq: Every day | ORAL | 3 refills | Status: AC

## 2019-11-30 NOTE — Addendum Note (Signed)
Addended by: Truddie Hidden on: 11/30/2019 03:44 PM   Modules accepted: Orders

## 2019-12-05 ENCOUNTER — Ambulatory Visit: Payer: BC Managed Care – PPO | Admitting: Cardiology

## 2019-12-19 ENCOUNTER — Other Ambulatory Visit: Payer: Self-pay

## 2019-12-20 ENCOUNTER — Encounter: Payer: Self-pay | Admitting: Cardiology

## 2019-12-20 ENCOUNTER — Other Ambulatory Visit: Payer: Self-pay

## 2019-12-20 ENCOUNTER — Ambulatory Visit: Payer: BC Managed Care – PPO | Admitting: Cardiology

## 2019-12-20 VITALS — BP 128/60 | HR 64 | Ht 62.0 in | Wt 173.0 lb

## 2019-12-20 DIAGNOSIS — E088 Diabetes mellitus due to underlying condition with unspecified complications: Secondary | ICD-10-CM

## 2019-12-20 DIAGNOSIS — E669 Obesity, unspecified: Secondary | ICD-10-CM

## 2019-12-20 DIAGNOSIS — E782 Mixed hyperlipidemia: Secondary | ICD-10-CM

## 2019-12-20 NOTE — Progress Notes (Signed)
Cardiology Office Note:    Date:  99/24/2683   ID:  Michele King, DOB 12-31-1955, MRN [id number]  PCP:  Lind Guest, NP  Cardiologist:  Jenean Lindau, MD   Referring MD: Lind Guest, NP    ASSESSMENT:    1. Mixed hyperlipidemia   2. Obesity (BMI 30.0-34.9)   3. Diabetes mellitus due to underlying condition with unspecified complications ([place])    PLAN:    In order of problems listed above:  1. Primary prevention stressed with the patient.  Importance of compliance with diet medication stressed and she vocalized understanding.  She walks about half an hour on a daily basis without any problems. 2. Mixed dyslipidemia: Diet was emphasized.  She promises to do better.  She is on statin therapy because of the calcium score mentioned below.  We will be checking her follow-up lipids. 3. Diabetes mellitus: I reviewed her lab work.  Importance of weight reduction stressed and she promises to do better.  This is followed by primary care physician. 4. Central obesity: Diet was emphasized and weight reduction stressed. 5. Abnormal asymmetric lymph node issues in the left axilla were discussed.  She already is aware of this and has appointment with her primary care providers to follow-up on this.  She knows that this will be followed only by her primary care physician and we will be only following the cardiac issues at hand. 6. Patient will be seen in follow-up appointment in 6 months or earlier if the patient has any concerns    Medication Adjustments/Labs and Tests Ordered: Current medicines are reviewed at length with the patient today.  Concerns regarding medicines are outlined above.  No orders of the defined types were placed in this encounter.  No orders of the defined types were placed in this encounter.    Chief Complaint  Patient presents with  . Follow-up     History of Present Illness:    Michele King is a 64 y.o. female.  Patient has past  medical history of mixed dyslipidemia diabetes mellitus and obesity.  She denies any problems at this time and takes care of activities of daily living.  No chest pain orthopnea or PND.  She now tells me that she is walking on a regular basis.  She walks half an hour a day.  She has central obesity.  At the time of my evaluation, the patient is alert awake oriented and in no distress.  Past Medical History:  Diagnosis Date  . BMI 34.0-34.9,adult 05/21/2019  . Calculus of gallbladder with chronic cholecystitis without obstruction 05/21/2019  . Chest discomfort 3/11  . 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 Elevated cholesterol   . Encounter for screening colonoscopy 05/21/2019  . Environmental allergies   . Hashimoto's disease   . Hyperlipidemia   . Obesity   . Obesity (BMI 30.0-34.9) 11/09/2019  . Postoperative examination 06/19/2019  . RUQ pain 05/21/2019  . Special screening examination for viral disease 05/21/2019  . Symptomatic menopausal or female climacteric states   . Thyroid disease    hypothyroidism  . Varicose veins of bilateral lower extremities with other complications   . Venous insufficiency of both lower extremities     Past Surgical History:  Procedure Laterality Date  . CHOLECYSTECTOMY  06/2019  . DILATION AND CURETTAGE OF UTERUS    . VEIN LIGATION AND STRIPPING  1989    Current Medications: Current Meds  Medication Sig  . cetirizine (ZYRTEC)  10 MG tablet Take 10 mg by mouth as needed for allergies.  Marland Kitchen ergocalciferol (VITAMIN D2) 1.25 MG (50000 UT) capsule Take 50,000 Units by mouth once a week.  . Estriol 10 % CREA Take 2 mg by mouth daily.  . fluticasone (FLONASE) 50 MCG/ACT nasal spray Place 2 sprays into both nostrils as needed for allergies or rhinitis.  Marland Kitchen levothyroxine (SYNTHROID) 100 MCG tablet Take 100 mcg by mouth every evening.  . metFORMIN (GLUCOPHAGE) 500 MG tablet Take 500 mg by mouth 2 (two) times daily.  . naltrexone (DEPADE) 50 MG tablet Take 4 mg by  mouth daily.   . nitroGLYCERIN (NITROSTAT) 0.4 MG SL tablet Place 1 tablet (0.4 mg total) under the tongue every 5 (five) minutes as needed for chest pain.  . RABEprazole (ACIPHEX) 20 MG tablet Take 20 mg by mouth daily.    . rosuvastatin (CRESTOR) 10 MG tablet Take 1 tablet (10 mg total) by mouth daily.  . [DISCONTINUED] progesterone 50 MG/ML injection Take 50 mg by mouth daily.     Allergies:   Patient has no known allergies.   Social History   Socioeconomic History  . Marital status: Married    Spouse name: Not on file  . Number of children: Not on file  . Years of education: Not on file  . Highest education level: Not on file  Occupational History  . Not on file  Tobacco Use  . Smoking status: Never Smoker  . Smokeless tobacco: Never Used  Vaping Use  . Vaping Use: Never used  Substance and Sexual Activity  . Alcohol use: Never  . Drug use: Never  . Sexual activity: Not on file  Other Topics Concern  . Not on file  Social History Narrative  . Not on file   Social Determinants of Health   Financial Resource Strain:   . Difficulty of Paying Living Expenses: Not on file  Food Insecurity:   . Worried About Charity fundraiser in the Last Year: Not on file  . Ran Out of Food in the Last Year: Not on file  Transportation Needs:   . Lack of Transportation (Medical): Not on file  . Lack of Transportation (Non-Medical): Not on file  Physical Activity:   . Days of Exercise per Week: Not on file  . Minutes of Exercise per Session: Not on file  Stress:   . Feeling of Stress : Not on file  Social Connections:   . Frequency of Communication with Friends and Family: Not on file  . Frequency of Social Gatherings with Friends and Family: Not on file  . Attends Religious Services: Not on file  . Active Member of Clubs or Organizations: Not on file  . Attends Archivist Meetings: Not on file  . Marital Status: Not on file     Family History: The patient's family  history includes Arthritis in her father and mother; Cancer in her mother; Heart attack in her father; Mitral valve prolapse in her sister.  ROS:   Please see the history of present illness.    All other systems reviewed and are negative.  EKGs/Labs/Other Studies Reviewed:    The following studies were reviewed today: 11/27/2019 22:24  CLINICAL DATA:  Risk stratification  EXAM: Coronary Calcium Score  TECHNIQUE: The patient was scanned on a [employer]. Axial non-contrast 3 mm slices were carried out through the heart. The data set was analyzed on a dedicated work station and scored using the [employer]  method.  FINDINGS: Non-cardiac: See separate report from [facility].  Ascending Aorta: Normal caliber.  No calcifications.  Pericardium: Normal  Coronary arteries: Normal coronary origins.  IMPRESSION: Coronary calcium score of 1. This was 54th percentile for age and sex matched control.  Fransico Him   Electronically Signed   By: Fransico Him   On: 11/27/2019 22:24  IMPRESSION: 1. Suggestion of some mildly prominent and asymmetric lymph nodes in the left axilla compared to the right. The largest measures approximately 11 mm in short axis. Correlation suggested with any palpable abnormality, any breast abnormality, recent mammography or recent vaccination in the left arm. 2. Probable small hiatal hernia.  Electronically Signed: By: Aletta Edouard M.D. On: 11/26/2019 08:47   Recent Labs: 11/29/2019: ALT 8; BUN 9; Creatinine, Ser 0.76; Potassium 4.0; Sodium 141  Recent Lipid Panel    Component Value Date/Time   CHOL 235 (H) 11/29/2019 0915   TRIG 139 11/29/2019 0915   HDL 57 11/29/2019 0915   CHOLHDL 4.1 11/29/2019 0915   LDLCALC 153 (H) 11/29/2019 0915    Physical Exam:    VS:  BP 128/60 (BP Location: Right Arm, Patient Position: Sitting)   Pulse 64   Ht 5\' 2"  (1.575 m)   Wt 173 lb (78.5 kg)   SpO2 97%   BMI 31.64  kg/m     Wt Readings from Last 3 Encounters:  12/20/19 173 lb (78.5 kg)  11/27/19 171 lb (77.6 kg)  11/09/19 171 lb 3.2 oz (77.7 kg)     GEN: Patient is in no acute distress HEENT: Normal NECK: No JVD; No carotid bruits LYMPHATICS: No lymphadenopathy CARDIAC: Hear sounds regular, 2/6 systolic murmur at the apex. RESPIRATORY:  Clear to auscultation without rales, wheezing or rhonchi  ABDOMEN: Soft, non-tender, non-distended MUSCULOSKELETAL:  No edema; No deformity  SKIN: Warm and dry NEUROLOGIC:  Alert and oriented x 3 PSYCHIATRIC:  Normal affect   Signed, Jenean Lindau, MD  12/20/2019 2:07 PM    [place]

## 2019-12-20 NOTE — Patient Instructions (Addendum)
Medication Instructions:  No medication changes. *If you need a refill on your cardiac medications before your next appointment, please call your pharmacy*   Lab Work: Your physician recommends that you return for lab work in: 6 weeks (01/31/20).  You need to have labs done when you are fasting.  You can come Monday through Friday 8:30 am to 12:00 pm and 1:15 to 4:30. You do not need to make an appointment as the order has already been placed. The labs you are going to have done are LFT and Lipids.  If you have labs (blood work) drawn today and your tests are completely normal, you will receive your results only by: Marland Kitchen MyChart Message (if you have MyChart) OR . A paper copy in the mail If you have any lab test that is abnormal or we need to change your treatment, we will call you to review the results.   Testing/Procedures: None ordered   Follow-Up: At [facility], you and your health needs are our priority.  As part of our continuing mission to provide you with exceptional heart care, we have created designated Provider Care Teams.  These Care Teams include your primary Cardiologist (physician) and Advanced Practice Providers (APPs -  Physician Assistants and Nurse Practitioners) who all work together to provide you with the care you need, when you need it.  We recommend signing up for the patient portal called "MyChart".  Sign up information is provided on this After Visit Summary.  MyChart is used to connect with patients for Virtual Visits (Telemedicine).  Patients are able to view lab/test results, encounter notes, upcoming appointments, etc.  Non-urgent messages can be sent to your provider as well.   To learn more about what you can do with MyChart, go to [email or web address].    Your next appointment:   6 month(s)  The format for your next appointment:   In Person  Provider:   Jyl Heinz, MD   Other Instructions NA

## 2020-01-15 ENCOUNTER — Telehealth: Payer: Self-pay | Admitting: Oncology

## 2020-01-15 NOTE — Telephone Encounter (Signed)
Patient called to get her 6 month Follow Up.  Scheduled for 12/22

## 2020-01-15 NOTE — Telephone Encounter (Signed)
Patient called to Reschedule Morning Appt to Afternoon on 12/23

## 2020-01-17 ENCOUNTER — Telehealth: Payer: Self-pay | Admitting: Cardiology

## 2020-01-17 NOTE — Telephone Encounter (Signed)
Need order for diagnostic mammogram with possible ultrasound-advised that PCP will be contacted to clarify this.   Contacted Michele Salter, NP office and spoke with Michele King (nurse for PCP)-informed about Michele King recommendations from calcium score result from 11/28/2019--(Mildly elevated calcium score. Please get patient in for liver lipid check and Chem-7. Report mentions of lymph nodes in the left axilla. Please send copy to primary care physician and speak to their nurse. Have patient call primary care doctor's office to discuss these noncardiac issues. Please document your conversation with her nurse. Copy primary care Michele Lindau, MD 11/28/2019 8:12 AM)  Michele King informed of the above suggestions from Michele King and says she will contact patient to arrange diagnostic mammogram with possible ultrasound. Copy faxed to PCP again.

## 2020-01-17 NOTE — Telephone Encounter (Signed)
Patient had a Calcium Score done 11/26/19. The test results showed an inflamed lymph node under the left armpit near the left breast. Patient went to [facility] to get a mammogram. [facility] advised her to get a diagnostic mammogram with possible ultrasound. The patient called her PCP to get orders for the mammogram. Her PCP asked what test showed the inflamed lymph node and when she said it was the CT that Dr. Geraldo Pitter ordered , so her PCP told her to call Dr. Geraldo Pitter.  The patient is caught in a loop and does not know who needs to order the appropriate test for her.   Please assist

## 2020-01-23 ENCOUNTER — Ambulatory Visit: Payer: BC Managed Care – PPO | Admitting: Oncology

## 2020-01-23 ENCOUNTER — Other Ambulatory Visit: Payer: BC Managed Care – PPO

## 2020-01-23 ENCOUNTER — Other Ambulatory Visit: Payer: Self-pay | Admitting: Oncology

## 2020-01-23 DIAGNOSIS — C9110 Chronic lymphocytic leukemia of B-cell type not having achieved remission: Secondary | ICD-10-CM

## 2020-01-23 NOTE — Progress Notes (Deleted)
[facility]  [address] [place],  [place]  [address] [phone]  Clinic Day:  01/23/2020  Referring physician: Lind Guest, NP   HISTORY OF PRESENT ILLNESS:  The patient is a 64 y.o. female with chronic lymphocytic leukemia, diagnosed per flow cytometry of her peripheral blood in August 2013.  Despite her white count being high over these past 3 years, it has held fairly stable over her past few visits without her other cell lines being affected by her disease.  She comes in today for routine follow up.  Since her last visit, the patient has been doing well.  She continues to deny having any B symptoms or bulky lymphadenopathy which concerns her for catabolic progression of her CLL.   PHYSICAL EXAM:  There were no vitals taken for this visit. Wt Readings from Last 3 Encounters:  12/20/19 173 lb (78.5 kg)  11/27/19 171 lb (77.6 kg)  11/09/19 171 lb 3.2 oz (77.7 kg)   There is no height or weight on file to calculate BMI. Performance status (ECOG): {CHL ONC [id number] Physical Exam Constitutional:      Appearance: Normal appearance. She is not ill-appearing.  HENT:     Mouth/Throat:     Mouth: Mucous membranes are moist.     Pharynx: Oropharynx is clear. No oropharyngeal exudate or posterior oropharyngeal erythema.  Cardiovascular:     Rate and Rhythm: Normal rate and regular rhythm.     Heart sounds: No murmur heard. No friction rub. No gallop.   Pulmonary:     Effort: Pulmonary effort is normal. No respiratory distress.     Breath sounds: Normal breath sounds. No wheezing, rhonchi or rales.  Chest:  Breasts:     Right: No axillary adenopathy or supraclavicular adenopathy.     Left: No axillary adenopathy or supraclavicular adenopathy.    Abdominal:     General: Bowel sounds are normal. There is no distension.     Palpations: Abdomen is soft. There is no mass.     Tenderness: There is no abdominal tenderness.   Musculoskeletal:        General: No swelling.     Right lower leg: No edema.     Left lower leg: No edema.  Lymphadenopathy:     Cervical: No cervical adenopathy.     Upper Body:     Right upper body: No supraclavicular or axillary adenopathy.     Left upper body: No supraclavicular or axillary adenopathy.     Lower Body: No right inguinal adenopathy. No left inguinal adenopathy.  Skin:    General: Skin is warm.     Coloration: Skin is not jaundiced.     Findings: No lesion or rash.  Neurological:     General: No focal deficit present.     Mental Status: She is alert and oriented to person, place, and time. Mental status is at baseline.     Cranial Nerves: Cranial nerves are intact.  Psychiatric:        Mood and Affect: Mood normal.        Behavior: Behavior normal.        Thought Content: Thought content normal.     LABS:   CBC Latest Ref Rng & Units 11/17/2011 09/15/2011  WBC 3.9 - 10.3 10e3/uL 21.1(H) 14.3(H)  Hemoglobin 11.6 - 15.9 g/dL 12.5 13.1  Hematocrit 34.8 - 46.6 % 38.6 40.0  Platelets 145 - 400 10e3/uL 229 218   CMP Latest Ref Rng &  Units 11/29/2019 11/17/2011 09/15/2011  Glucose 65 - 99 mg/dL 88 95 103(H)  BUN 8 - 27 mg/dL 9 21.0 15  Creatinine 0.57 - 1.00 mg/dL 0.76 0.7 0.52  Sodium 134 - 144 mmol/L 141 138 139  Potassium 3.5 - 5.2 mmol/L 4.0 3.9 4.2  Chloride 96 - 106 mmol/L 102 103 108  CO2 20 - 29 mmol/L 26 25 26   Calcium 8.7 - 10.3 mg/dL 9.3 9.5 9.3  Total Protein 6.0 - 8.5 g/dL 6.2 6.5 6.3  Total Bilirubin 0.0 - 1.2 mg/dL 0.6 0.30 0.4  Alkaline Phos 44 - 121 IU/L 107 69 59  AST 0 - 40 IU/L 14 12 14   ALT 0 - 32 IU/L 8 16 17      No results found for: CEA1 / No results found for: CEA1 No results found for: PSA1 No results found for: [id number] No results found for: [id number]  No results found for: TOTALPROTELP, ALBUMINELP, A1GS, A2GS, BETS, BETA2SER, GAMS, MSPIKE, SPEI No results found for: TIBC, FERRITIN, IRONPCTSAT No results found for: LDH  No  results found for: AFPTUMOR, TOTALPROTELP, ALBUMINELP, A1GS, A2GS, BETS, BETA2SER, GAMS, MSPIKE, SPEI, LDH, CEA1, PSA1, IGASERUM, IGGSERUM, IGMSERUM, THGAB, THYROGLB  Recent Review Flowsheet Data   There is no flowsheet data to display.      STUDIES:  No results found.    ASSESSMENT & PLAN:   Assessment/Plan:  A 64 y.o. female with *** .The patient understands all the plans discussed today and is in agreement with them.      Harm Jou Macarthur Critchley, MD

## 2020-01-24 ENCOUNTER — Inpatient Hospital Stay: Payer: BC Managed Care – PPO | Admitting: Oncology

## 2020-01-24 ENCOUNTER — Telehealth: Payer: Self-pay | Admitting: Oncology

## 2020-01-24 ENCOUNTER — Inpatient Hospital Stay: Payer: BC Managed Care – PPO

## 2020-01-24 NOTE — Telephone Encounter (Signed)
Patient called to reschedule Appt to Jan 11

## 2020-02-11 DIAGNOSIS — Z86010 Personal history of colonic polyps: Secondary | ICD-10-CM | POA: Insufficient documentation

## 2020-02-12 ENCOUNTER — Inpatient Hospital Stay: Payer: Self-pay

## 2020-02-12 ENCOUNTER — Inpatient Hospital Stay: Payer: Self-pay | Admitting: Oncology

## 2020-03-07 ENCOUNTER — Telehealth: Payer: Self-pay | Admitting: Oncology

## 2020-03-07 NOTE — Telephone Encounter (Signed)
Patient rescheduled to 2/21 Labs, Follow Up due to testing positive for COVID today

## 2020-03-10 ENCOUNTER — Inpatient Hospital Stay: Payer: Self-pay | Admitting: Oncology

## 2020-03-10 ENCOUNTER — Inpatient Hospital Stay: Payer: Self-pay

## 2020-03-11 ENCOUNTER — Telehealth: Payer: Self-pay | Admitting: Adult Health

## 2020-03-11 NOTE — Telephone Encounter (Signed)
Called to discuss with patient about COVID-19 symptoms and the use of one of the available treatments for those with mild to moderate Covid symptoms and at a high risk of hospitalization.  Pt appears to qualify for outpatient treatment due to co-morbid conditions and/or a member of an at-risk group in accordance with the FDA Emergency Use Authorization.    Symptom onset: ? Vaccinated ? Booster? ? Immunocompromised?  Qualifiers: Yes   Unable to reach pt - Left message to call back on vm .   Rexene Edison NP

## 2020-03-24 ENCOUNTER — Telehealth: Payer: Self-pay | Admitting: Oncology

## 2020-03-24 ENCOUNTER — Inpatient Hospital Stay (INDEPENDENT_AMBULATORY_CARE_PROVIDER_SITE_OTHER): Payer: Medicare PPO | Admitting: Oncology

## 2020-03-24 ENCOUNTER — Other Ambulatory Visit: Payer: Self-pay | Admitting: Oncology

## 2020-03-24 ENCOUNTER — Other Ambulatory Visit: Payer: Self-pay | Admitting: Hematology and Oncology

## 2020-03-24 ENCOUNTER — Inpatient Hospital Stay: Payer: Medicare PPO | Attending: Oncology

## 2020-03-24 ENCOUNTER — Other Ambulatory Visit: Payer: Self-pay

## 2020-03-24 VITALS — BP 156/73 | HR 75 | Temp 98.1°F | Resp 14 | Ht 62.0 in | Wt 178.3 lb

## 2020-03-24 DIAGNOSIS — C9110 Chronic lymphocytic leukemia of B-cell type not having achieved remission: Secondary | ICD-10-CM

## 2020-03-24 LAB — CBC: RBC: 4.58 (ref 3.87–5.11)

## 2020-03-24 LAB — CBC AND DIFFERENTIAL
HCT: 41 (ref 36–46)
Hemoglobin: 12.6 (ref 12.0–16.0)
Neutrophils Absolute: 6.77
Platelets: 178 (ref 150–399)
WBC: 112.9

## 2020-03-24 NOTE — Progress Notes (Signed)
Michele King  [address] [place],  [place]  [address] [phone]  Clinic Day:  03/24/2020  Referring physician: Lind Guest, NP   HISTORY OF PRESENT ILLNESS:  The patient is a 65 y.o. female with chronic lymphocytic leukemia, diagnosed per flow cytometry of her peripheral blood in August 2013.  Despite her white count being high over these past 3 years, it has held fairly stable over her past visits without her other cell lines being affected by her disease.  She comes in today for routine follow up.  Since her last visit, the patient has been doing well.  She continues to deny having any B symptoms or bulky lymphadenopathy which concerns her for catabolic progression of her CLL.   PHYSICAL EXAM:  Blood pressure (!) 156/73, pulse 75, temperature 98.1 F (36.7 C), resp. rate 14, height 5\' 2"  (1.575 m), weight 178 lb 4.8 oz (80.9 kg), SpO2 95 %. Wt Readings from Last 3 Encounters:  03/24/20 178 lb 4.8 oz (80.9 kg)  12/20/19 173 lb (78.5 kg)  11/27/19 171 lb (77.6 kg)   Body mass index is 32.61 kg/m. Performance status (ECOG): 0 - Asymptomatic Physical Exam Constitutional:      Appearance: Normal appearance. She is not ill-appearing.  HENT:     Mouth/Throat:     Mouth: Mucous membranes are moist.     Pharynx: Oropharynx is clear. No oropharyngeal exudate or posterior oropharyngeal erythema.  Cardiovascular:     Rate and Rhythm: Normal rate and regular rhythm.     Heart sounds: No murmur heard. No friction rub. No gallop.   Pulmonary:     Effort: Pulmonary effort is normal. No respiratory distress.     Breath sounds: Normal breath sounds. No wheezing, rhonchi or rales.  Chest:  Breasts:     Right: No axillary adenopathy or supraclavicular adenopathy.     Left: No axillary adenopathy or supraclavicular adenopathy.    Abdominal:     General: Bowel sounds are normal. There is no distension.     Palpations: Abdomen is soft. There is no  mass.     Tenderness: There is no abdominal tenderness.  Musculoskeletal:        General: No swelling.     Right lower leg: No edema.     Left lower leg: No edema.  Lymphadenopathy:     Cervical: No cervical adenopathy.     Upper Body:     Right upper body: No supraclavicular or axillary adenopathy.     Left upper body: No supraclavicular or axillary adenopathy.     Lower Body: No right inguinal adenopathy. No left inguinal adenopathy.  Skin:    General: Skin is warm.     Coloration: Skin is not jaundiced.     Findings: No lesion or rash.  Neurological:     General: No focal deficit present.     Mental Status: She is alert and oriented to person, place, and time. Mental status is at baseline.     Cranial Nerves: Cranial nerves are intact.  Psychiatric:        Mood and Affect: Mood normal.        Behavior: Behavior normal.        Thought Content: Thought content normal.     LABS:    ASSESSMENT & PLAN:  Assessment/Plan:  A 65 y.o. female with chronic lymphocytic leukemia.  When comparing her labs today to what they have been in the past, her elevated  white count is not much different than what it has been over these past years.  I am pleased as her hemoglobin and platelets remain at ideal levels.  Furthermore, there remains nothing per her physical exam today which is concerning for catabolic disease progression.  Clinically, the patient is doing well.  As that is the case, I will see her back in another 6 months for repeat clinical assessment. The patient understands all the plans discussed today and is in agreement with them.      Neo Yepiz Macarthur Critchley, MD

## 2020-03-24 NOTE — Telephone Encounter (Signed)
Per 2/21 LOS, patient scheduled for Aug Appt's.  Patient entered Appt's in her phone

## 2020-04-01 ENCOUNTER — Encounter: Payer: Self-pay | Admitting: Oncology

## 2020-06-17 ENCOUNTER — Ambulatory Visit: Payer: Medicare PPO | Admitting: Sports Medicine

## 2020-06-17 ENCOUNTER — Other Ambulatory Visit: Payer: Self-pay

## 2020-06-17 ENCOUNTER — Encounter: Payer: Self-pay | Admitting: Sports Medicine

## 2020-06-17 DIAGNOSIS — B351 Tinea unguium: Secondary | ICD-10-CM

## 2020-06-17 DIAGNOSIS — E088 Diabetes mellitus due to underlying condition with unspecified complications: Secondary | ICD-10-CM

## 2020-06-17 NOTE — Patient Instructions (Signed)
Vinegar soaks or Epsom salt 1 cup of white distilled vinegar to 8 cups of warm water.  Soak 20 mins. May repeat soak two times per week.  If there is thickness to nails may file nails after soaks or after bath/shower with nail file and apply tea tree oil. Apply oil daily to nails after filing for the best result.   Get over the counter lamisil or lotrim spray to use on feet at bedtime

## 2020-06-17 NOTE — Progress Notes (Signed)
Subjective: Michele King is a 65 y.o. female patient seen today in office with complaint of nail discoloration on multiple toenails reports that her primary doctor gave her a prescription that she has been using since March with some improvement reports that the left third toenail and the right fourth and third toenails have cleared up more so than the right great toenail.  Reports that she also has noticed a small dark streak on the left great toenail at the medial corner.  Patient denies any significant redness warmth swelling but does notice that her skin has been peeling and turned a little red from time to time with occasional sharp pain at the left hallux.  Patient denies any other constitutional symptoms at this time.  Review of system noncontributory.  Patient Active Problem List   Diagnosis Date Noted  . History of colonic polyps 02/11/2020  . Diabetes mellitus due to underlying condition with unspecified complications ([place]) 29/56/2130  . Obesity (BMI 30.0-34.9) 11/09/2019  . Thyroid disease   . Hyperlipidemia   . Obesity   . Varicose veins of bilateral lower extremities with other complications   . Venous insufficiency of both lower extremities   . Symptomatic menopausal or female climacteric states   . Environmental allergies   . Elevated cholesterol   . Chronic lymphatic leukemia ([place])   . Hashimoto's disease   . Postoperative examination 06/19/2019  . BMI 34.0-34.9,adult 05/21/2019  . Calculus of gallbladder with chronic cholecystitis without obstruction 05/21/2019  . Encounter for screening colonoscopy 05/21/2019  . RUQ pain 05/21/2019  . Special screening examination for viral disease 05/21/2019  . Chest discomfort 04/2009    Current Outpatient Medications on File Prior to Visit  Medication Sig Dispense Refill  . cetirizine (ZYRTEC) 10 MG tablet Take 10 mg by mouth as needed for allergies.    Marland Kitchen ergocalciferol (VITAMIN D2) 1.25 MG (50000 UT) capsule Take 50,000  Units by mouth once a week.    . Estriol 10 % CREA Take 2 mg by mouth daily.    . fluticasone (FLONASE) 50 MCG/ACT nasal spray Place 2 sprays into both nostrils as needed for allergies or rhinitis.    Marland Kitchen levothyroxine (SYNTHROID) 100 MCG tablet Take 100 mcg by mouth every evening.    . metFORMIN (GLUCOPHAGE) 500 MG tablet Take 500 mg by mouth 2 (two) times daily.    . naltrexone (DEPADE) 50 MG tablet Take 4 mg by mouth daily.     . nitroGLYCERIN (NITROSTAT) 0.4 MG SL tablet Place 1 tablet (0.4 mg total) under the tongue every 5 (five) minutes as needed for chest pain. 90 tablet 3  . RABEprazole (ACIPHEX) 20 MG tablet Take 20 mg by mouth daily.      . rosuvastatin (CRESTOR) 10 MG tablet Take 1 tablet (10 mg total) by mouth daily. 90 tablet 3   No current facility-administered medications on file prior to visit.    No Known Allergies  Objective: Physical Exam  General: Well developed, nourished, no acute distress, awake, alert and oriented x 3  Vascular: Dorsalis pedis artery 2/4 bilateral, Posterior tibial artery 1/4 bilateral, skin temperature warm to warm proximal to distal bilateral lower extremities, no varicosities, pedal hair present bilateral.  Neurological: Gross sensation present via light touch bilateral.   Dermatological: Skin is warm, dry, and supple bilateral, Nails 1-10 are tender, short thick with right first third and fourth toes most involved greater than the left third toenail, and discolored with mild subungal debris, no webspace  macerations present bilateral, peeling skin plantarly, no open lesions present bilateral, no callus/corns/hyperkeratotic tissue present bilateral. No signs of infection bilateral.  Musculoskeletal: No symptomatic boney deformities noted bilateral. Muscular strength within normal limits without painon range of motion. No pain with calf compression bilateral.  Assessment and Plan:  Problem List Items Addressed This Visit      Endocrine    Diabetes mellitus due to underlying condition with unspecified complications ([place])    Other Visit Diagnoses    Nail fungus    -  Primary      -Examined patient -Discussed treatment options for painful dystrophic nails  -Advised patient to continue with Penlac solution to her toenails may add on soaking and filing as directed -Made patient well aware that it can take 9 to 12 months for a topical to work -Advised patient to also add on using over-the-counter Lotrimin spray -Encouraged good hygiene habits -Patient to return in 3 to 4 months for follow up evaluation and recommend if symptoms have not improved at this visit to proceed with doing a nail culture Landis Martins, DPM

## 2020-06-18 ENCOUNTER — Ambulatory Visit: Payer: BC Managed Care – PPO | Admitting: Cardiology

## 2020-09-15 NOTE — Progress Notes (Signed)
[facility]  [address] [place],  [place]  [address] [phone]  Clinic Day:  09/22/2020  Referring physician: Haydee Salter, NP  This document serves as a record of services personally performed by Marice Potter, MD. It was created on their behalf by Curry,Lauren E, a trained medical scribe. The creation of this record is based on the scribe's personal observations and the provider's statements to them.  HISTORY OF PRESENT ILLNESS:  The patient is a 65 y.o. female with chronic lymphocytic leukemia, diagnosed per flow cytometry of her peripheral blood in August 2013.  Despite her white count being high over these past 3 years, it has held fairly stable over her past visits without her other cell lines being affected by her disease.  She comes in today for routine follow up.  Since her last visit, the patient has been doing okay.  She has had increased fatigue, but she has had to have her Synthroid adjusted for her Hashimoto's thyroiditis.  She continues to deny having any B symptoms or bulky lymphadenopathy which concerns her for catabolic progression of her CLL.   PHYSICAL EXAM:  Blood pressure 138/88, pulse 80, temperature 98.5 F (36.9 C), resp. rate 16, height '5\' 2"'$  (1.575 m), weight 182 lb 6.4 oz (82.7 kg), SpO2 96 %. Wt Readings from Last 3 Encounters:  09/22/20 182 lb 6.4 oz (82.7 kg)  03/24/20 178 lb 4.8 oz (80.9 kg)  12/20/19 173 lb (78.5 kg)   Body mass index is 33.36 kg/m. Performance status (ECOG): 0 - Asymptomatic Physical Exam Constitutional:      Appearance: Normal appearance. She is not ill-appearing.  HENT:     Mouth/Throat:     Mouth: Mucous membranes are moist.     Pharynx: Oropharynx is clear. No oropharyngeal exudate or posterior oropharyngeal erythema.  Cardiovascular:     Rate and Rhythm: Normal rate and regular rhythm.     Heart sounds: No murmur heard.   No friction rub. No gallop.  Pulmonary:     Effort:  Pulmonary effort is normal. No respiratory distress.     Breath sounds: Normal breath sounds. No wheezing, rhonchi or rales.  Abdominal:     General: Bowel sounds are normal. There is no distension.     Palpations: Abdomen is soft. There is no mass.     Tenderness: There is no abdominal tenderness.  Musculoskeletal:        General: No swelling.     Right lower leg: No edema.     Left lower leg: No edema.  Lymphadenopathy:     Cervical: No cervical adenopathy.     Upper Body:     Right upper body: No supraclavicular or axillary adenopathy.     Left upper body: No supraclavicular or axillary adenopathy.     Lower Body: No right inguinal adenopathy. No left inguinal adenopathy.  Skin:    General: Skin is warm.     Coloration: Skin is not jaundiced.     Findings: No lesion or rash.  Neurological:     General: No focal deficit present.     Mental Status: She is alert and oriented to person, place, and time. Mental status is at baseline.     Cranial Nerves: Cranial nerves are intact.  Psychiatric:        Mood and Affect: Mood normal.        Behavior: Behavior normal.        Thought Content: Thought content normal.  LABS:    ASSESSMENT & PLAN:  Assessment/Plan:  A 65 y.o. female with chronic lymphocytic leukemia.  When comparing her labs today to what they have been in the past, her elevated white count is lower than previously.  I am pleased as her hemoglobin and platelets remain at ideal levels.  Furthermore, there remains nothing per her physical exam today which is concerning for catabolic disease progression.  Clinically, the patient is doing well.  As that is the case, I will see her back in another 6 months for repeat clinical assessment. The patient understands all the plans discussed today and is in agreement with them.     I, Rita Ohara, am acting as scribe for Marice Potter, MD    I have reviewed this report as typed by the medical scribe, and it is complete and  accurate.  Dequincy Macarthur Critchley, MD

## 2020-09-17 ENCOUNTER — Ambulatory Visit: Payer: Medicare PPO | Admitting: Sports Medicine

## 2020-09-22 ENCOUNTER — Inpatient Hospital Stay: Payer: Medicare PPO | Admitting: Oncology

## 2020-09-22 ENCOUNTER — Other Ambulatory Visit: Payer: Self-pay | Admitting: Oncology

## 2020-09-22 ENCOUNTER — Encounter: Payer: Self-pay | Admitting: Oncology

## 2020-09-22 ENCOUNTER — Telehealth: Payer: Self-pay | Admitting: Oncology

## 2020-09-22 ENCOUNTER — Inpatient Hospital Stay: Payer: Medicare PPO | Attending: Oncology

## 2020-09-22 ENCOUNTER — Telehealth: Payer: Self-pay

## 2020-09-22 VITALS — BP 138/88 | HR 80 | Temp 98.5°F | Resp 16 | Ht 62.0 in | Wt 182.4 lb

## 2020-09-22 DIAGNOSIS — C9110 Chronic lymphocytic leukemia of B-cell type not having achieved remission: Secondary | ICD-10-CM

## 2020-09-22 LAB — CBC AND DIFFERENTIAL
HCT: 41 (ref 36–46)
Hemoglobin: 12.9 (ref 12.0–16.0)
Neutrophils Absolute: 4.63
Platelets: 147 — AB (ref 150–399)
WBC: 92.5

## 2020-09-22 LAB — CBC: RBC: 4.75 (ref 3.87–5.11)

## 2020-09-22 NOTE — Telephone Encounter (Signed)
Per 8/22 LOS, patient schedule for Feb 2023 Appt's.  Gave patient Appt Summary

## 2020-09-22 NOTE — Telephone Encounter (Signed)
Sharyn Lull in Hematology called with critical WBC. WBC: 92.5. Dr. Bobby Rumpf aware.

## 2020-10-23 ENCOUNTER — Other Ambulatory Visit: Payer: Self-pay | Admitting: *Deleted

## 2020-10-23 DIAGNOSIS — I872 Venous insufficiency (chronic) (peripheral): Secondary | ICD-10-CM

## 2020-10-29 ENCOUNTER — Other Ambulatory Visit: Payer: Self-pay

## 2020-10-29 ENCOUNTER — Ambulatory Visit: Payer: Medicare PPO | Admitting: Physician Assistant

## 2020-10-29 ENCOUNTER — Ambulatory Visit (HOSPITAL_COMMUNITY)
Admission: RE | Admit: 2020-10-29 | Discharge: 2020-10-29 | Disposition: A | Discharge status: Home / Self Care | Payer: Medicare PPO | Source: Ambulatory Visit | Attending: Vascular Surgery | Admitting: Vascular Surgery

## 2020-10-29 VITALS — BP 140/69 | HR 62 | Temp 97.6°F | Resp 20 | Ht 62.0 in | Wt 182.5 lb

## 2020-10-29 DIAGNOSIS — I781 Nevus, non-neoplastic: Secondary | ICD-10-CM | POA: Diagnosis not present

## 2020-10-29 DIAGNOSIS — I8393 Asymptomatic varicose veins of bilateral lower extremities: Secondary | ICD-10-CM

## 2020-10-29 DIAGNOSIS — I872 Venous insufficiency (chronic) (peripheral): Secondary | ICD-10-CM | POA: Diagnosis present

## 2020-10-29 NOTE — Progress Notes (Signed)
Office Note     CC:  follow up Requesting Provider:  Haydee Salter, NP  HPI: Michele King is a 65 y.o. ([birth year]) female who presents for evaluation of bilateral lower extremity edema.  She has a history of left greater saphenous vein stripping as well as stab phlebectomy in 1989.  She has noticed an increase in swelling of her ankles as well as more prominence in her spider veins.  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  She has not been wearing compression regularly.  She is not elevating her legs during the day.  She denies any history of DVT, venous ulcerations, or trauma.  She also denies any tobacco use.   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 05/21/2019   Chest discomfort 3/11   Chronic lymphatic leukemia (HCC)    Elevated cholesterol    Encounter for screening colonoscopy 05/21/2019   Environmental allergies    Hashimoto's disease    Hyperlipidemia    Obesity    Obesity (BMI 30.0-34.9) 11/09/2019   Postoperative examination 06/19/2019   RUQ pain 05/21/2019   Special screening examination for viral disease 05/21/2019   Symptomatic menopausal or female climacteric states    Thyroid disease    hypothyroidism   Varicose veins of bilateral lower extremities with other complications    Venous insufficiency of both lower extremities     Past Surgical History:  Procedure Laterality Date   CHOLECYSTECTOMY  06/2019   DILATION AND CURETTAGE OF UTERUS     VEIN LIGATION AND STRIPPING  1989    Social History   Socioeconomic History   Marital status: Married    Spouse name: Not on file   Number of children: Not on file   Years of education: Not on file   Highest education level: Not on file  Occupational History   Not on file  Tobacco Use   Smoking  status: Never   Smokeless tobacco: Never  Vaping Use   Vaping Use: Never used  Substance and Sexual Activity   Alcohol use: Never   Drug use: Never   Sexual activity: Not on file  Other Topics Concern   Not on file  Social History Narrative   Not on file   Social Determinants of Health   Financial Resource Strain: Not on file  Food Insecurity: Not on file  Transportation Needs: Not on file  Physical Activity: Not on file  Stress: Not on file  Social Connections: Not on file  Intimate Partner Violence: Not on file    Family History  Problem Relation Age of Onset   Cancer Mother    Arthritis Mother    Arthritis Father    Heart attack Father    Mitral valve prolapse Sister     Current Outpatient Medications  Medication Sig Dispense Refill   cetirizine (ZYRTEC) 10 MG tablet Take 10 mg by mouth as needed for allergies.     ergocalciferol (VITAMIN D2) 1.25 MG (50000 UT) capsule Take 50,000 Units by mouth once a week.     Estriol 10 % CREA Take 2 mg by mouth daily.     fluticasone (FLONASE) 50 MCG/ACT nasal spray Place 2 sprays into both nostrils as needed for allergies or rhinitis.     levothyroxine (SYNTHROID)  100 MCG tablet Take 100 mcg by mouth every evening.     metFORMIN (GLUCOPHAGE) 500 MG tablet Take 500 mg by mouth 2 (two) times daily.     naltrexone (DEPADE) 50 MG tablet Take 4 mg by mouth daily.      nitroGLYCERIN (NITROSTAT) 0.4 MG SL tablet Place 1 tablet (0.4 mg total) under the tongue every 5 (five) minutes as needed for chest pain. 90 tablet 3   PROGESTERONE PO Take by mouth.     RABEprazole (ACIPHEX) 20 MG tablet Take 20 mg by mouth daily.       rosuvastatin (CRESTOR) 10 MG tablet Take 1 tablet (10 mg total) by mouth daily. (Patient taking differently: Take 5 mg by mouth daily.) 90 tablet 3   No current facility-administered medications for this visit.    No Known Allergies   REVIEW OF SYSTEMS:   [X]  denotes positive finding, [ ]  denotes negative  finding Cardiac  Comments:  Chest pain or chest pressure:    Shortness of breath upon exertion:    Short of breath when lying flat:    Irregular heart rhythm:        Vascular    Pain in calf, thigh, or hip brought on by ambulation:    Pain in feet at night that wakes you up from your sleep:     Blood clot in your veins:    Leg swelling:         Pulmonary    Oxygen at home:    Productive cough:     Wheezing:         Neurologic    Sudden weakness in arms or legs:     Sudden numbness in arms or legs:     Sudden onset of difficulty speaking or slurred speech:    Temporary loss of vision in one eye:     Problems with dizziness:         Gastrointestinal    Blood in stool:     Vomited blood:         Genitourinary    Burning when urinating:     Blood in urine:        Psychiatric    Major depression:         Hematologic    Bleeding problems:    Problems with blood clotting too easily:        Skin    Rashes or ulcers:        Constitutional    Fever or chills:      PHYSICAL EXAMINATION:  Vitals:   10/29/20 1335  BP: 140/69  Pulse: 62  Resp: 20  Temp: 97.6 F (36.4 C)  TempSrc: Temporal  SpO2: 96%  Weight: 182 lb 8 oz (82.8 kg)  Height: 5\' 2"  (1.575 m)    General:  WDWN in NAD; vital signs documented above Gait: Not observed HENT: WNL, normocephalic Pulmonary: normal non-labored breathing , without Rales, rhonchi,  wheezing Cardiac: regular HR Abdomen: soft, NT, no masses Skin: without rashes Vascular Exam/Pulses:  Right Left  Radial 2+ (normal) 2+ (normal)  DP 2+ (normal) 2+ (normal)   Extremities: Some pitting edema at the level of the ankles bilaterally; scattered spider veins of both lower extremities especially of left lateral thigh knee and calf Musculoskeletal: no muscle wasting or atrophy  Neurologic: A&O X 3;  No focal weakness or paresthesias are detected Psychiatric:  The pt has Normal affect.   Non-Invasive Vascular Imaging:   Left  lower  extremity venous reflux study consistent with prior greater saphenous vein stripping Mid femoral vein insufficiency Negative for DVT    ASSESSMENT/PLAN:: 65 y.o. female here for evaluation of bilateral lower extremity edema and prominent spider veins  -Left lower extremity venous reflux study is consistent with prior greater saphenous vein stripping.  She has only mild deep venous reflux.  Study was also negative for DVT. -Recommendations included regular use of knee-high 15 to 20 mmHg compression stockings, elevation of her legs above the level of her heart periodically throughout the day, and avoiding prolonged sitting and standing.  She can also use NSAIDs when symptoms are at their peak. -Patient was also interested in learning more about sclerotherapy.  Estell Harpin, RN will discuss sclerotherapy and possible treatment with the patient today -Patient can otherwise follow-up on an as-needed basis   Dagoberto Ligas, PA-C [facility] [phone]  Clinic MD:   Scot Dock

## 2021-03-18 NOTE — Progress Notes (Incomplete)
[facility]  [address] [place],  [place]  [address] [phone]  Clinic Day:  03/25/2021  Referring physician: Haydee Salter, NP  This document serves as a record of services personally performed by Marice Potter, MD. It was created on their behalf by Curry,Lauren E, a trained medical scribe. The creation of this record is based on the scribe's personal observations and the provider's statements to them.  HISTORY OF PRESENT ILLNESS:  The patient is a 66 y.o. female with chronic lymphocytic leukemia, diagnosed per flow cytometry of her peripheral blood in August 2013.  Despite her white count being high over these past 3 years, it has held fairly stable over her past visits without her other cell lines being affected by her disease.  She comes in today for routine follow up.  Since her last visit, the patient has been doing okay.  She has had increased fatigue, but she has had to have her Synthroid adjusted for her Hashimoto's thyroiditis.  She continues to deny having any B symptoms or bulky lymphadenopathy which concerns her for catabolic progression of her CLL.   PHYSICAL EXAM:  There were no vitals taken for this visit. Wt Readings from Last 3 Encounters:  10/29/20 182 lb 8 oz (82.8 kg)  09/22/20 182 lb 6.4 oz (82.7 kg)  03/24/20 178 lb 4.8 oz (80.9 kg)   There is no height or weight on file to calculate BMI. Performance status (ECOG): 0 - Asymptomatic Physical Exam Constitutional:      Appearance: Normal appearance. She is not ill-appearing.  HENT:     Mouth/Throat:     Mouth: Mucous membranes are moist.     Pharynx: Oropharynx is clear. No oropharyngeal exudate or posterior oropharyngeal erythema.  Cardiovascular:     Rate and Rhythm: Normal rate and regular rhythm.     Heart sounds: No murmur heard.   No friction rub. No gallop.  Pulmonary:     Effort: Pulmonary effort is normal. No respiratory distress.     Breath sounds: Normal  breath sounds. No wheezing, rhonchi or rales.  Abdominal:     General: Bowel sounds are normal. There is no distension.     Palpations: Abdomen is soft. There is no mass.     Tenderness: There is no abdominal tenderness.  Musculoskeletal:        General: No swelling.     Right lower leg: No edema.     Left lower leg: No edema.  Lymphadenopathy:     Cervical: No cervical adenopathy.     Upper Body:     Right upper body: No supraclavicular or axillary adenopathy.     Left upper body: No supraclavicular or axillary adenopathy.     Lower Body: No right inguinal adenopathy. No left inguinal adenopathy.  Skin:    General: Skin is warm.     Coloration: Skin is not jaundiced.     Findings: No lesion or rash.  Neurological:     General: No focal deficit present.     Mental Status: She is alert and oriented to person, place, and time. Mental status is at baseline.  Psychiatric:        Mood and Affect: Mood normal.        Behavior: Behavior normal.        Thought Content: Thought content normal.   LABS:    ASSESSMENT & PLAN:  Assessment/Plan:  A 66 y.o. female with chronic lymphocytic leukemia.  When comparing  her labs today to what they have been in the past, her elevated white count is lower than previously.  I am pleased as her hemoglobin and platelets remain at ideal levels.  Furthermore, there remains nothing per her physical exam today which is concerning for catabolic disease progression.  Clinically, the patient is doing well.  As that is the case, I will see her back in another 6 months for repeat clinical assessment. The patient understands all the plans discussed today and is in agreement with them.     I, Rita Ohara, am acting as scribe for Marice Potter, MD    I have reviewed this report as typed by the medical scribe, and it is complete and accurate.  Dequincy Macarthur Critchley, MD

## 2021-03-25 ENCOUNTER — Ambulatory Visit: Payer: Medicare PPO | Admitting: Oncology

## 2021-03-25 ENCOUNTER — Other Ambulatory Visit: Payer: Medicare PPO

## 2021-04-09 IMAGING — CT CT CARDIAC CORONARY ARTERY CALCIUM SCORE
3 series · 14 of 20 positions shown, 15 images · non-contrast
Comparison: None.
COMPARISON: None.
COMPARISON: None.

Addendum:
EXAM:
OVER-READ INTERPRETATION  CT CHEST

The following report is an over-read performed by radiologist Dr.
Jn Eddy Delso [REDACTED] on 11/26/2019. This
over-read does not include interpretation of cardiac or coronary
anatomy or pathology. The coronary calcium score interpretation by
the cardiologist is attached.
CLINICAL DATA: Risk stratification
Coronary Calcium Score
TECHNIQUE: The patient was scanned on a Siemens Force scanner. Axial
non-contrast 3 mm slices were carried out through the heart. The
data set was analyzed on a dedicated work station and scored using
the Agatson method.

[Series 2: casc 3.0 bv41 2 bestdiast 74 % · axial · 0.36mm/px · z∈[+1307,+1379]mm · 4 of 40 slices shown, 5 images]
[im 8/40  vessel]
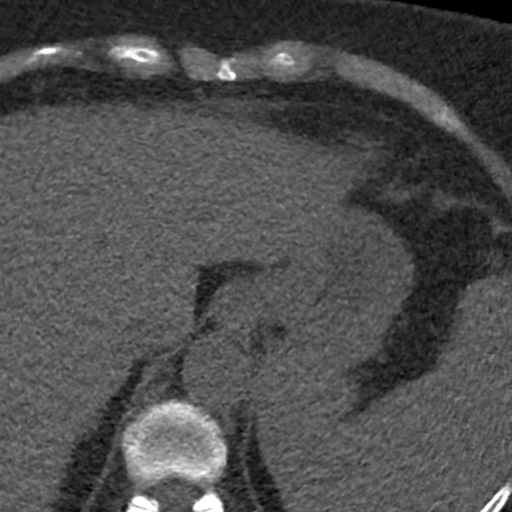
[im 8/40  lung]
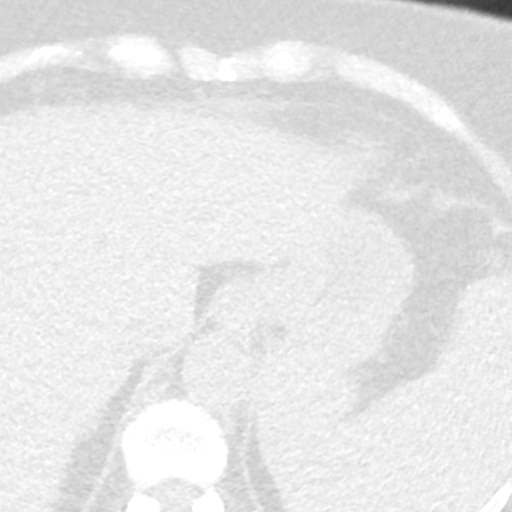
[im 16/40  vessel]
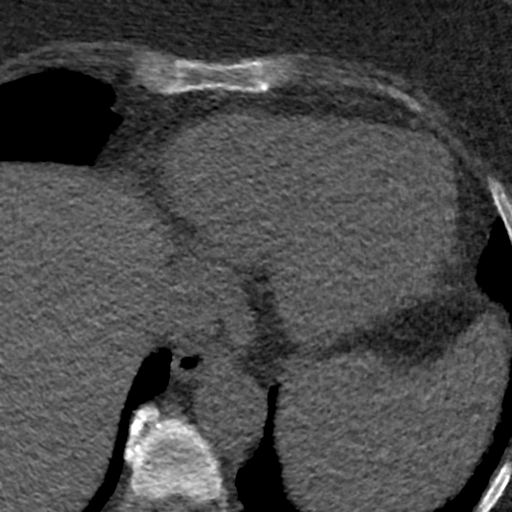
[im 24/40  vessel]
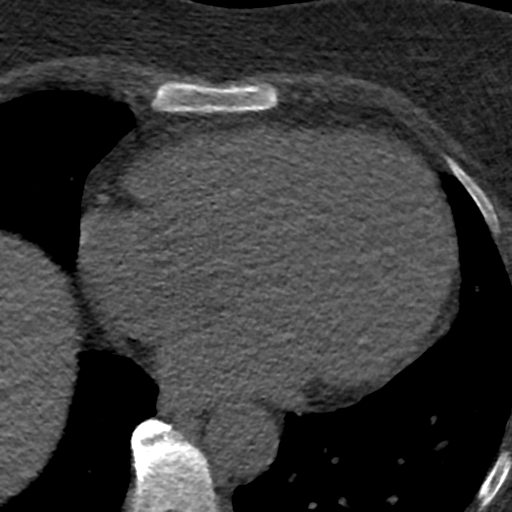
[im 32/40  vessel]
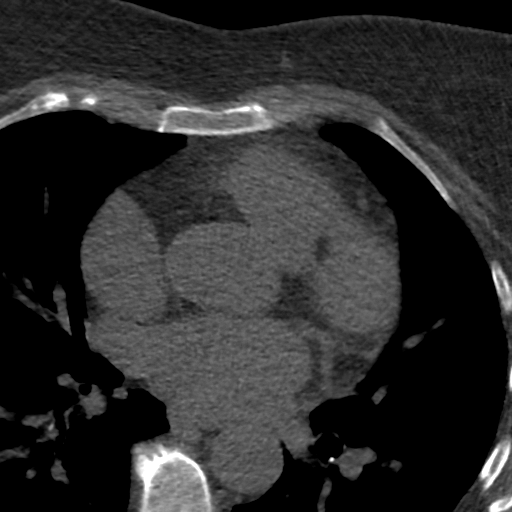

[Series 3: lung 74 % · axial · 0.62mm/px · z∈[+1304,+1382]mm · 5 of 40 slices shown]
[im 7/40  lung]
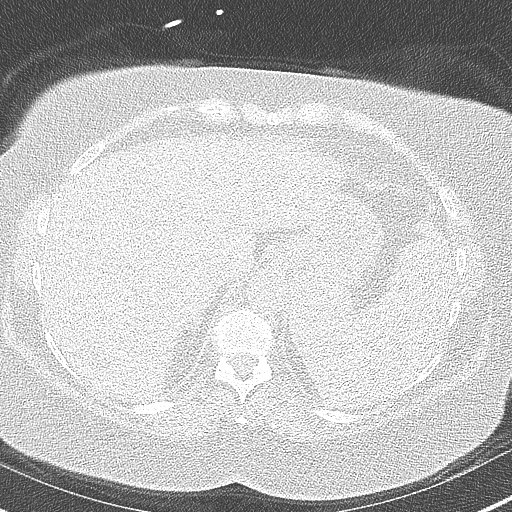
[im 14/40  lung]
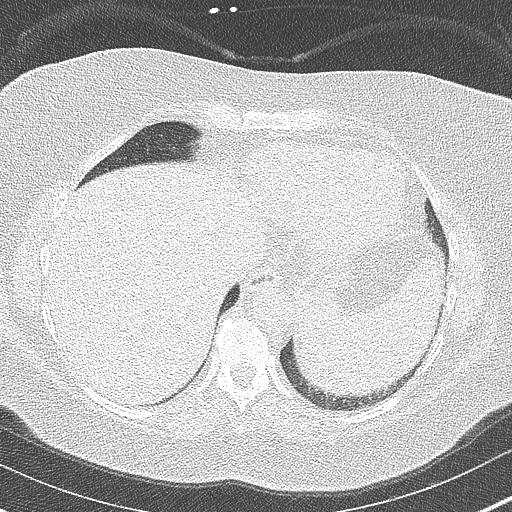
[im 20/40  lung]
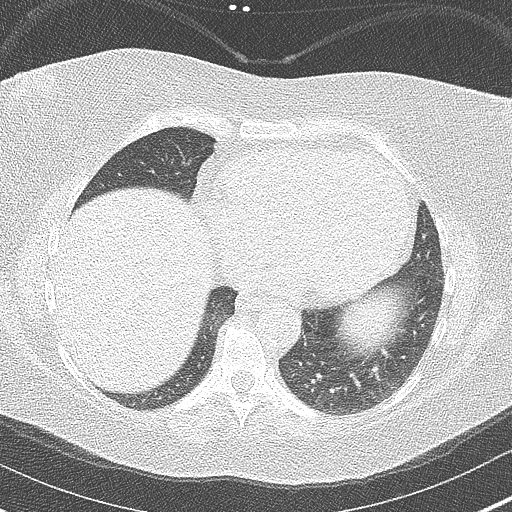
[im 27/40  lung]
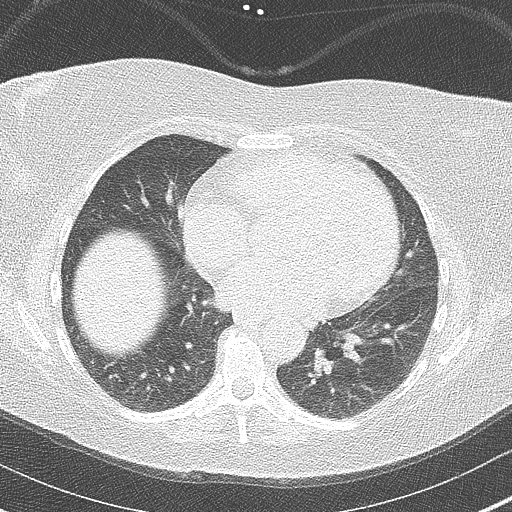
[im 33/40  lung]
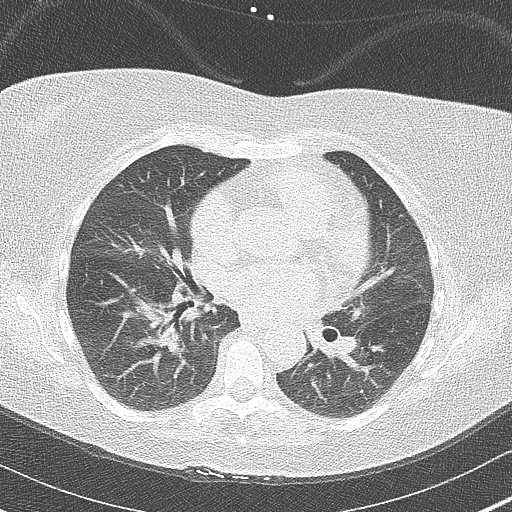

[Series 4: lung st 74 % · axial · 0.62mm/px · z∈[+1304,+1382]mm · 5 of 40 slices shown]
[im 7/40  lung]
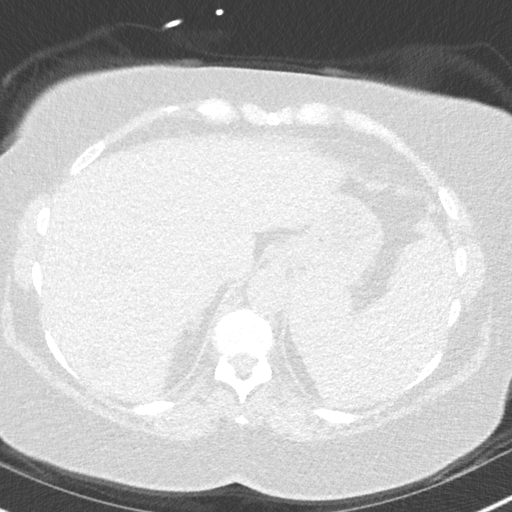
[im 14/40  lung]
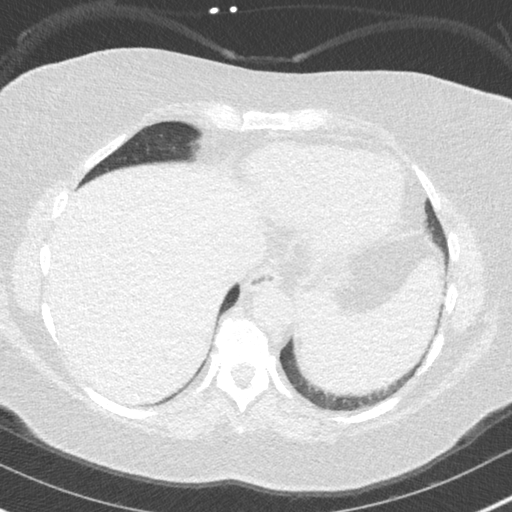
[im 20/40  lung]
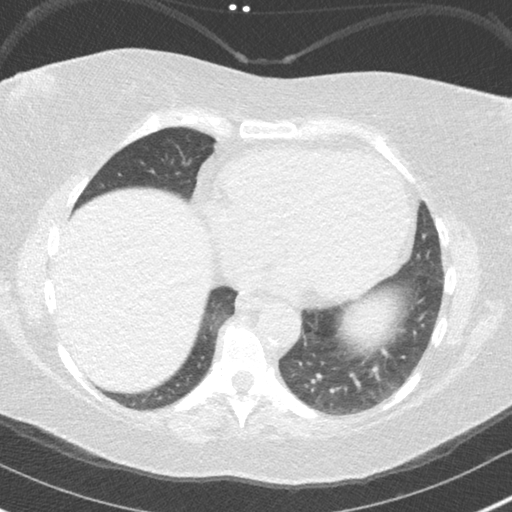
[im 27/40  lung]
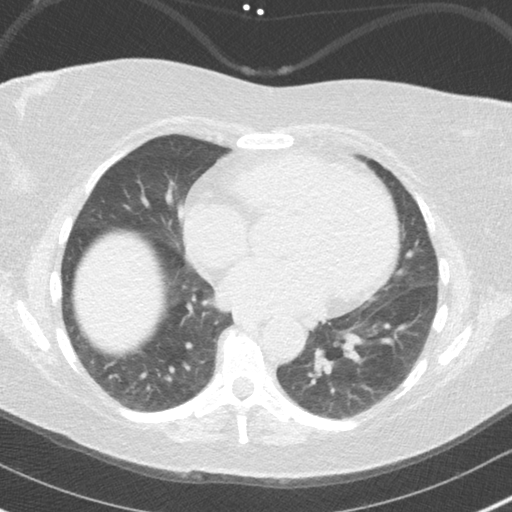
[im 33/40  lung]
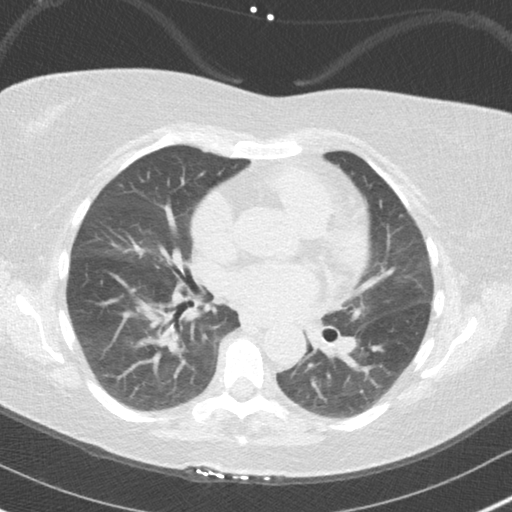

[14 of 20 positions shown; findings below may reference images not displayed]

FINDINGS: Vascular: No significant vascular findings. Normal heart size. No
pericardial effusion.

Mediastinum/Nodes: Visualized mediastinum and hilar regions
demonstrate no lymphadenopathy or masses. There is visualization of
some mildly prominent and asymmetric lymph nodes in the left axilla
compared to the right. Three mildly prominent lymph nodes are
identified with the largest measuring approximately 11 mm in short
axis. Correlation suggested with any palpable abnormality, any
breast abnormality, recent mammography or recent vaccination in the
left arm. Probable small hiatal hernia.

Lungs/Pleura: Visualized lungs show no evidence of pulmonary edema,
consolidation, pneumothorax, nodule or pleural fluid.

Upper Abdomen: No acute abnormality.

Musculoskeletal: No chest wall mass or suspicious bone lesions
identified.
IMPRESSION: 1. Suggestion of some mildly prominent and asymmetric lymph nodes in
the left axilla compared to the right. The largest measures
approximately 11 mm in short axis. Correlation suggested with any
palpable abnormality, any breast abnormality, recent mammography or
recent vaccination in the left arm.
2. Probable small hiatal hernia.
FINDINGS: Non-cardiac: See separate report from [REDACTED].

Ascending Aorta: Normal caliber.  No calcifications.

Pericardium: Normal

Coronary arteries: Normal coronary origins.
IMPRESSION: Coronary calcium score of 1. This was 54th percentile for age and
sex matched control.

Luftetari Maxharri

ADDENDUM:
Also noted on re-examination of the unenhanced imaging from the
cardiac coronary score is a vague low-density area in the posterior
aspect of the visualized right lobe of the liver. This is
indeterminate and may be either a cyst or lesion and further
evaluation with either CT of the abdomen with contrast or MRI of the
abdomen with contrast is recommended.

*** End of Addendum ***
Addendum:
EXAM:
OVER-READ INTERPRETATION  CT CHEST

The following report is an over-read performed by radiologist Dr.
Jn Eddy Delso [REDACTED] on 11/26/2019. This
over-read does not include interpretation of cardiac or coronary
anatomy or pathology. The coronary calcium score interpretation by
the cardiologist is attached.
FINDINGS: Vascular: No significant vascular findings. Normal heart size. No
pericardial effusion.

Mediastinum/Nodes: Visualized mediastinum and hilar regions
demonstrate no lymphadenopathy or masses. There is visualization of
some mildly prominent and asymmetric lymph nodes in the left axilla
compared to the right. Three mildly prominent lymph nodes are
identified with the largest measuring approximately 11 mm in short
axis. Correlation suggested with any palpable abnormality, any
breast abnormality, recent mammography or recent vaccination in the
left arm. Probable small hiatal hernia.

Lungs/Pleura: Visualized lungs show no evidence of pulmonary edema,
consolidation, pneumothorax, nodule or pleural fluid.

Upper Abdomen: No acute abnormality.

Musculoskeletal: No chest wall mass or suspicious bone lesions
identified.
IMPRESSION: 1. Suggestion of some mildly prominent and asymmetric lymph nodes in
the left axilla compared to the right. The largest measures
approximately 11 mm in short axis. Correlation suggested with any
palpable abnormality, any breast abnormality, recent mammography or
recent vaccination in the left arm.
2. Probable small hiatal hernia.
FINDINGS: Non-cardiac: See separate report from [REDACTED].

Ascending Aorta: Normal caliber.  No calcifications.

Pericardium: Normal

Coronary arteries: Normal coronary origins.
IMPRESSION: Coronary calcium score of 1. This was 54th percentile for age and
sex matched control.

Luftetari Maxharri

*** End of Addendum ***
EXAM:
OVER-READ INTERPRETATION  CT CHEST

The following report is an over-read performed by radiologist Dr.
Jn Eddy Delso [REDACTED] on 11/26/2019. This
over-read does not include interpretation of cardiac or coronary
anatomy or pathology. The coronary calcium score interpretation by
the cardiologist is attached.
FINDINGS: Vascular: No significant vascular findings. Normal heart size. No
pericardial effusion.

Mediastinum/Nodes: Visualized mediastinum and hilar regions
demonstrate no lymphadenopathy or masses. There is visualization of
some mildly prominent and asymmetric lymph nodes in the left axilla
compared to the right. Three mildly prominent lymph nodes are
identified with the largest measuring approximately 11 mm in short
axis. Correlation suggested with any palpable abnormality, any
breast abnormality, recent mammography or recent vaccination in the
left arm. Probable small hiatal hernia.

Lungs/Pleura: Visualized lungs show no evidence of pulmonary edema,
consolidation, pneumothorax, nodule or pleural fluid.

Upper Abdomen: No acute abnormality.

Musculoskeletal: No chest wall mass or suspicious bone lesions
identified.
IMPRESSION: 1. Suggestion of some mildly prominent and asymmetric lymph nodes in
the left axilla compared to the right. The largest measures
approximately 11 mm in short axis. Correlation suggested with any
palpable abnormality, any breast abnormality, recent mammography or
recent vaccination in the left arm.
2. Probable small hiatal hernia.

## 2022-03-25 DIAGNOSIS — H01004 Unspecified blepharitis left upper eyelid: Secondary | ICD-10-CM | POA: Diagnosis not present

## 2022-04-07 DIAGNOSIS — E038 Other specified hypothyroidism: Secondary | ICD-10-CM | POA: Diagnosis not present

## 2022-04-07 DIAGNOSIS — Z79899 Other long term (current) drug therapy: Secondary | ICD-10-CM | POA: Diagnosis not present

## 2022-04-07 DIAGNOSIS — R062 Wheezing: Secondary | ICD-10-CM | POA: Diagnosis not present

## 2022-04-07 DIAGNOSIS — E1169 Type 2 diabetes mellitus with other specified complication: Secondary | ICD-10-CM | POA: Diagnosis not present

## 2022-04-07 DIAGNOSIS — J40 Bronchitis, not specified as acute or chronic: Secondary | ICD-10-CM | POA: Diagnosis not present

## 2022-04-07 DIAGNOSIS — E7800 Pure hypercholesterolemia, unspecified: Secondary | ICD-10-CM | POA: Diagnosis not present

## 2022-04-07 DIAGNOSIS — K219 Gastro-esophageal reflux disease without esophagitis: Secondary | ICD-10-CM | POA: Diagnosis not present

## 2022-04-07 DIAGNOSIS — Z6832 Body mass index (BMI) 32.0-32.9, adult: Secondary | ICD-10-CM | POA: Diagnosis not present

## 2022-05-18 DIAGNOSIS — G4733 Obstructive sleep apnea (adult) (pediatric): Secondary | ICD-10-CM | POA: Diagnosis not present

## 2022-05-18 DIAGNOSIS — E8809 Other disorders of plasma-protein metabolism, not elsewhere classified: Secondary | ICD-10-CM | POA: Diagnosis not present

## 2022-05-19 DIAGNOSIS — Z6831 Body mass index (BMI) 31.0-31.9, adult: Secondary | ICD-10-CM | POA: Diagnosis not present

## 2022-05-19 DIAGNOSIS — R209 Unspecified disturbances of skin sensation: Secondary | ICD-10-CM | POA: Diagnosis not present

## 2022-05-19 DIAGNOSIS — M542 Cervicalgia: Secondary | ICD-10-CM | POA: Diagnosis not present

## 2022-05-21 DIAGNOSIS — M47812 Spondylosis without myelopathy or radiculopathy, cervical region: Secondary | ICD-10-CM | POA: Diagnosis not present

## 2022-05-21 DIAGNOSIS — R209 Unspecified disturbances of skin sensation: Secondary | ICD-10-CM | POA: Diagnosis not present

## 2022-05-21 DIAGNOSIS — M542 Cervicalgia: Secondary | ICD-10-CM | POA: Diagnosis not present

## 2022-06-01 DIAGNOSIS — M549 Dorsalgia, unspecified: Secondary | ICD-10-CM | POA: Diagnosis not present

## 2022-06-08 DIAGNOSIS — M542 Cervicalgia: Secondary | ICD-10-CM | POA: Diagnosis not present

## 2022-06-08 DIAGNOSIS — M4802 Spinal stenosis, cervical region: Secondary | ICD-10-CM | POA: Diagnosis not present

## 2022-06-08 DIAGNOSIS — R200 Anesthesia of skin: Secondary | ICD-10-CM | POA: Diagnosis not present

## 2022-07-01 DIAGNOSIS — F419 Anxiety disorder, unspecified: Secondary | ICD-10-CM | POA: Diagnosis not present

## 2022-07-01 DIAGNOSIS — K219 Gastro-esophageal reflux disease without esophagitis: Secondary | ICD-10-CM | POA: Diagnosis not present

## 2022-07-01 DIAGNOSIS — M7989 Other specified soft tissue disorders: Secondary | ICD-10-CM | POA: Diagnosis not present

## 2022-07-01 DIAGNOSIS — E038 Other specified hypothyroidism: Secondary | ICD-10-CM | POA: Diagnosis not present

## 2022-07-01 DIAGNOSIS — E7800 Pure hypercholesterolemia, unspecified: Secondary | ICD-10-CM | POA: Diagnosis not present

## 2022-07-01 DIAGNOSIS — Z6831 Body mass index (BMI) 31.0-31.9, adult: Secondary | ICD-10-CM | POA: Diagnosis not present

## 2022-07-01 DIAGNOSIS — N951 Menopausal and female climacteric states: Secondary | ICD-10-CM | POA: Diagnosis not present

## 2022-07-01 DIAGNOSIS — E1169 Type 2 diabetes mellitus with other specified complication: Secondary | ICD-10-CM | POA: Diagnosis not present

## 2022-07-01 DIAGNOSIS — M4802 Spinal stenosis, cervical region: Secondary | ICD-10-CM | POA: Diagnosis not present

## 2022-07-06 DIAGNOSIS — R600 Localized edema: Secondary | ICD-10-CM | POA: Diagnosis not present

## 2022-07-07 DIAGNOSIS — M79605 Pain in left leg: Secondary | ICD-10-CM | POA: Diagnosis not present

## 2022-07-07 DIAGNOSIS — W19XXXA Unspecified fall, initial encounter: Secondary | ICD-10-CM | POA: Diagnosis not present

## 2022-07-07 DIAGNOSIS — S8002XA Contusion of left knee, initial encounter: Secondary | ICD-10-CM | POA: Diagnosis not present

## 2022-10-13 DIAGNOSIS — E1169 Type 2 diabetes mellitus with other specified complication: Secondary | ICD-10-CM | POA: Diagnosis not present

## 2022-10-13 DIAGNOSIS — E038 Other specified hypothyroidism: Secondary | ICD-10-CM | POA: Diagnosis not present

## 2022-10-13 DIAGNOSIS — C9110 Chronic lymphocytic leukemia of B-cell type not having achieved remission: Secondary | ICD-10-CM | POA: Diagnosis not present

## 2022-10-13 DIAGNOSIS — Z79899 Other long term (current) drug therapy: Secondary | ICD-10-CM | POA: Diagnosis not present

## 2022-10-13 DIAGNOSIS — E7800 Pure hypercholesterolemia, unspecified: Secondary | ICD-10-CM | POA: Diagnosis not present

## 2022-10-13 DIAGNOSIS — Z6834 Body mass index (BMI) 34.0-34.9, adult: Secondary | ICD-10-CM | POA: Diagnosis not present

## 2022-10-13 DIAGNOSIS — E559 Vitamin D deficiency, unspecified: Secondary | ICD-10-CM | POA: Diagnosis not present

## 2022-10-13 DIAGNOSIS — Z0000 Encounter for general adult medical examination without abnormal findings: Secondary | ICD-10-CM | POA: Diagnosis not present

## 2022-10-13 DIAGNOSIS — Z1331 Encounter for screening for depression: Secondary | ICD-10-CM | POA: Diagnosis not present

## 2022-10-14 LAB — LAB REPORT - SCANNED: EGFR: 95

## 2022-11-25 ENCOUNTER — Other Ambulatory Visit: Payer: Self-pay | Admitting: Oncology

## 2022-11-25 NOTE — Progress Notes (Unsigned)
[facility]  [address] [place],  [place]  [address] [phone]  Clinic Day:  11/26/2022  Referring physician: Rolm Gala, NP  HISTORY OF PRESENT ILLNESS:  The patient is a 67 y.o. female with chronic lymphocytic leukemia, diagnosed per flow cytometry of her peripheral blood in August 2013.  Despite her white count being high over these past years, it had held fairly stable over her past visits without her other cell lines being affected by her disease.  She comes in today to re-establish  follow up.  Since her last visit 2 years ago, the patient has been doing okay.  She continues to deny having any B symptoms or bulky lymphadenopathy which concerns her for catabolic progression of her CLL.   PHYSICAL EXAM:  Blood pressure (!) 170/75, pulse 68, temperature 98.9 F (37.2 C), resp. rate 16, height 5\' 2"  (1.575 m), weight 188 lb 11.2 oz (85.6 kg), SpO2 97%. Wt Readings from Last 3 Encounters:  11/26/22 188 lb 11.2 oz (85.6 kg)  10/29/20 182 lb 8 oz (82.8 kg)  09/22/20 182 lb 6.4 oz (82.7 kg)   Body mass index is 34.51 kg/m. Performance status (ECOG): 0 - Asymptomatic Physical Exam Constitutional:      Appearance: Normal appearance. She is not ill-appearing.  HENT:     Mouth/Throat:     Mouth: Mucous membranes are moist.     Pharynx: Oropharynx is clear. No oropharyngeal exudate or posterior oropharyngeal erythema.  Cardiovascular:     Rate and Rhythm: Normal rate and regular rhythm.     Heart sounds: No murmur heard.    No friction rub. No gallop.  Pulmonary:     Effort: Pulmonary effort is normal. No respiratory distress.     Breath sounds: Normal breath sounds. No wheezing, rhonchi or rales.  Abdominal:     General: Bowel sounds are normal. There is no distension.     Palpations: Abdomen is soft. There is no mass.     Tenderness: There is no abdominal tenderness.  Musculoskeletal:        General: No swelling.     Right lower leg: No  edema.     Left lower leg: No edema.  Lymphadenopathy:     Cervical: No cervical adenopathy.     Upper Body:     Right upper body: No supraclavicular or axillary adenopathy.     Left upper body: No supraclavicular or axillary adenopathy.     Lower Body: No right inguinal adenopathy. No left inguinal adenopathy.  Skin:    General: Skin is warm.     Coloration: Skin is not jaundiced.     Findings: No lesion or rash.  Neurological:     General: No focal deficit present.     Mental Status: She is alert and oriented to person, place, and time. Mental status is at baseline.  Psychiatric:        Mood and Affect: Mood normal.        Behavior: Behavior normal.        Thought Content: Thought content normal.    LABS:    ASSESSMENT & PLAN:  Assessment/Plan:  A 67 y.o. female with chronic lymphocytic leukemia.  When comparing her labs today to what they have been in the past, her elevated white count is lower than previously.  I am pleased as her hemoglobin and platelets remain at ideal levels.  Furthermore, there remains nothing per her physical exam today which is concerning for  catabolic disease progression.  Clinically, the patient is doing well.  As that is the case, I will see her back in another 6 months for repeat clinical assessment. The patient understands all the plans discussed today and is in agreement with them.    Zyah Gomm Kirby Funk, MD

## 2022-11-25 NOTE — Progress Notes (Unsigned)
[facility]  [address] [place],  [place]  [address] [phone]  Clinic Day:  03/24/2020  Referring physician: No ref. provider found   HISTORY OF PRESENT ILLNESS:  The patient is a 66 y.o. female with chronic lymphocytic leukemia, diagnosed per flow cytometry of her peripheral blood in August 2013.  Despite her white count being high over these past 3 years, it has held fairly stable over her past visits without her other cell lines being affected by her disease.  She comes in today for routine follow up.  Since her last visit, the patient has been doing well.  She continues to deny having any B symptoms or bulky lymphadenopathy which concerns her for catabolic progression of her CLL.   PHYSICAL EXAM:  There were no vitals taken for this visit. Wt Readings from Last 3 Encounters:  10/29/20 182 lb 8 oz (82.8 kg)  09/22/20 182 lb 6.4 oz (82.7 kg)  03/24/20 178 lb 4.8 oz (80.9 kg)   There is no height or weight on file to calculate BMI. Performance status (ECOG): 0 - Asymptomatic Physical Exam Constitutional:      Appearance: Normal appearance. She is not ill-appearing.  HENT:     Mouth/Throat:     Mouth: Mucous membranes are moist.     Pharynx: Oropharynx is clear. No oropharyngeal exudate or posterior oropharyngeal erythema.  Cardiovascular:     Rate and Rhythm: Normal rate and regular rhythm.     Heart sounds: No murmur heard.    No friction rub. No gallop.  Pulmonary:     Effort: Pulmonary effort is normal. No respiratory distress.     Breath sounds: Normal breath sounds. No wheezing, rhonchi or rales.  Abdominal:     General: Bowel sounds are normal. There is no distension.     Palpations: Abdomen is soft. There is no mass.     Tenderness: There is no abdominal tenderness.  Musculoskeletal:        General: No swelling.     Right lower leg: No edema.     Left lower leg: No edema.  Lymphadenopathy:     Cervical: No cervical  adenopathy.     Upper Body:     Right upper body: No supraclavicular or axillary adenopathy.     Left upper body: No supraclavicular or axillary adenopathy.     Lower Body: No right inguinal adenopathy. No left inguinal adenopathy.  Skin:    General: Skin is warm.     Coloration: Skin is not jaundiced.     Findings: No lesion or rash.  Neurological:     General: No focal deficit present.     Mental Status: She is alert and oriented to person, place, and time. Mental status is at baseline.  Psychiatric:        Mood and Affect: Mood normal.        Behavior: Behavior normal.        Thought Content: Thought content normal.     LABS:    ASSESSMENT & PLAN:  Assessment/Plan:  A 67 y.o. female with chronic lymphocytic leukemia.  When comparing her labs today to what they have been in the past, her elevated white count is not much different than what it has been over these past years.  I am pleased as her hemoglobin and platelets remain at ideal levels.  Furthermore, there remains nothing per her physical exam today which is concerning for catabolic disease progression.  Clinically, the  patient is doing well.  As that is the case, I will see her back in another 6 months for repeat clinical assessment. The patient understands all the plans discussed today and is in agreement with them.      Murphy Bundick Kirby Funk, MD

## 2022-11-26 ENCOUNTER — Other Ambulatory Visit: Payer: Self-pay | Admitting: Oncology

## 2022-11-26 ENCOUNTER — Inpatient Hospital Stay: Payer: Medicare PPO | Admitting: Oncology

## 2022-11-26 ENCOUNTER — Inpatient Hospital Stay: Payer: Medicare PPO | Attending: Oncology

## 2022-11-26 VITALS — BP 170/75 | HR 68 | Temp 98.9°F | Resp 16 | Ht 62.0 in | Wt 188.7 lb

## 2022-11-26 DIAGNOSIS — C9110 Chronic lymphocytic leukemia of B-cell type not having achieved remission: Secondary | ICD-10-CM

## 2022-11-26 LAB — CBC AND DIFFERENTIAL
HCT: 39 (ref 36–46)
Hemoglobin: 12.3 (ref 12.0–16.0)
Neutrophils Absolute: 10.44
Platelets: 150 10*3/uL (ref 150–400)
WBC: 80.3

## 2022-11-26 LAB — CBC: RBC: 4.58 (ref 3.87–5.11)

## 2022-11-26 NOTE — Progress Notes (Signed)
CRITICAL VALUE STICKER  CRITICAL VALUE:  WBC 80.3  RECEIVER (on-site recipient of call):  Dyane Dustman RN   DATE & TIME NOTIFIED:   11/26/2022 @ 1521  MESSENGER (representative from lab):  [facility]  MD NOTIFIED:   Dr. Melvyn Neth  TIME OF NOTIFICATION:  1523  RESPONSE:  Scheduled to see patient.

## 2022-11-29 DIAGNOSIS — I160 Hypertensive urgency: Secondary | ICD-10-CM | POA: Diagnosis not present

## 2022-11-29 DIAGNOSIS — R030 Elevated blood-pressure reading, without diagnosis of hypertension: Secondary | ICD-10-CM | POA: Diagnosis not present

## 2022-11-30 DIAGNOSIS — F331 Major depressive disorder, recurrent, moderate: Secondary | ICD-10-CM | POA: Diagnosis not present

## 2022-11-30 DIAGNOSIS — Z6834 Body mass index (BMI) 34.0-34.9, adult: Secondary | ICD-10-CM | POA: Diagnosis not present

## 2022-11-30 DIAGNOSIS — I10 Essential (primary) hypertension: Secondary | ICD-10-CM | POA: Diagnosis not present

## 2022-12-06 DIAGNOSIS — Z6834 Body mass index (BMI) 34.0-34.9, adult: Secondary | ICD-10-CM | POA: Diagnosis not present

## 2022-12-06 DIAGNOSIS — I10 Essential (primary) hypertension: Secondary | ICD-10-CM | POA: Diagnosis not present

## 2022-12-09 ENCOUNTER — Encounter: Payer: Self-pay | Admitting: Internal Medicine

## 2022-12-14 DIAGNOSIS — F331 Major depressive disorder, recurrent, moderate: Secondary | ICD-10-CM | POA: Diagnosis not present

## 2022-12-14 DIAGNOSIS — Z6835 Body mass index (BMI) 35.0-35.9, adult: Secondary | ICD-10-CM | POA: Diagnosis not present

## 2022-12-14 DIAGNOSIS — Z79899 Other long term (current) drug therapy: Secondary | ICD-10-CM | POA: Diagnosis not present

## 2022-12-14 DIAGNOSIS — I10 Essential (primary) hypertension: Secondary | ICD-10-CM | POA: Diagnosis not present

## 2023-02-10 DIAGNOSIS — Z6835 Body mass index (BMI) 35.0-35.9, adult: Secondary | ICD-10-CM | POA: Diagnosis not present

## 2023-02-10 DIAGNOSIS — K219 Gastro-esophageal reflux disease without esophagitis: Secondary | ICD-10-CM | POA: Diagnosis not present

## 2023-02-10 DIAGNOSIS — E7800 Pure hypercholesterolemia, unspecified: Secondary | ICD-10-CM | POA: Diagnosis not present

## 2023-02-10 DIAGNOSIS — E038 Other specified hypothyroidism: Secondary | ICD-10-CM | POA: Diagnosis not present

## 2023-02-10 DIAGNOSIS — Z79899 Other long term (current) drug therapy: Secondary | ICD-10-CM | POA: Diagnosis not present

## 2023-02-10 DIAGNOSIS — M2550 Pain in unspecified joint: Secondary | ICD-10-CM | POA: Diagnosis not present

## 2023-02-10 DIAGNOSIS — I10 Essential (primary) hypertension: Secondary | ICD-10-CM | POA: Diagnosis not present

## 2023-02-10 DIAGNOSIS — E1169 Type 2 diabetes mellitus with other specified complication: Secondary | ICD-10-CM | POA: Diagnosis not present

## 2023-02-22 ENCOUNTER — Ambulatory Visit (HOSPITAL_BASED_OUTPATIENT_CLINIC_OR_DEPARTMENT_OTHER)
Admission: EM | Admit: 2023-02-22 | Discharge: 2023-02-22 | Disposition: A | Discharge status: Home / Self Care | Payer: Medicare PPO

## 2023-02-22 ENCOUNTER — Encounter (HOSPITAL_BASED_OUTPATIENT_CLINIC_OR_DEPARTMENT_OTHER): Payer: Self-pay

## 2023-02-22 DIAGNOSIS — R062 Wheezing: Secondary | ICD-10-CM | POA: Diagnosis not present

## 2023-02-22 DIAGNOSIS — J101 Influenza due to other identified influenza virus with other respiratory manifestations: Secondary | ICD-10-CM

## 2023-02-22 DIAGNOSIS — R051 Acute cough: Secondary | ICD-10-CM

## 2023-02-22 LAB — POCT INFLUENZA A/B
Influenza A, POC: POSITIVE — AB
Influenza B, POC: NEGATIVE

## 2023-02-22 MED ORDER — PROMETHAZINE-DM 6.25-15 MG/5ML PO SYRP: 5.0000 mL | ORAL_SOLUTION | Freq: Four times a day (QID) | ORAL | 0 refills | Status: DC | PRN

## 2023-02-22 MED ORDER — OSELTAMIVIR PHOSPHATE 75 MG PO CAPS: 75.0000 mg | ORAL_CAPSULE | Freq: Two times a day (BID) | ORAL | 0 refills | Status: DC

## 2023-02-22 MED ORDER — COMPACT SPACE CHAMBER DEVI: 0 refills | Status: DC

## 2023-02-22 MED ORDER — ALBUTEROL SULFATE HFA 108 (90 BASE) MCG/ACT IN AERS: 2.0000 | INHALATION_SPRAY | RESPIRATORY_TRACT | 0 refills | Status: DC | PRN

## 2023-02-22 NOTE — ED Triage Notes (Signed)
States has had nasal congestion "for weeks". Granddaughter tested positive for Influenza 1 week ago. Denies shortness of breath. Moist cough noted. Low grade fever at home.

## 2023-02-22 NOTE — Discharge Instructions (Addendum)
Flu type B and COVID were negative.  Influenza type a is positive.  Get plenty of fluids and rest.  Will treat with Tamiflu, 75 mg, twice daily for 5 days.  Promethazine DM, 1 teaspoon, every 6 hours, as needed for cough.  Provided albuterol inhaler and spacer, 2 puffs, every 4 hours, as needed for wheezing.  Follow-up if symptoms do not improve, worsen or new symptoms occur.

## 2023-02-22 NOTE — ED Provider Notes (Signed)
[name]    CSN: [id number] Arrival date & time: 02/22/23  1514      History   Chief Complaint Chief Complaint  Patient presents with   Cough   Nasal Congestion    HPI [facility] Michele King is a 68 y.o. female.   Reports acute onset cough and congestion for 24 to 40 hours.  It started Sunday night, 02/20/2023.  Denies fever, nausea, vomiting, diarrhea, constipation.  One of her grandchildren had flu last week and she is the one who took the child to the doctor and was caring for the child.   Cough Associated symptoms: no chest pain, no chills, no ear pain, no fever, no rash, no shortness of breath and no sore throat     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 05/21/2019   Chest discomfort 3/11   Chronic lymphatic leukemia (HCC)    Elevated cholesterol    Encounter for screening colonoscopy 05/21/2019   Environmental allergies    Hashimoto's disease    Hyperlipidemia    Obesity    Obesity (BMI 30.0-34.9) 11/09/2019   Postoperative examination 06/19/2019   RUQ pain 05/21/2019   Special screening examination for viral disease 05/21/2019   Symptomatic menopausal or female climacteric states    Thyroid disease    hypothyroidism   Varicose veins of bilateral lower extremities with other complications    Venous insufficiency of both lower extremities     Patient Active Problem List   Diagnosis Date Noted   History of colonic polyps 02/11/2020   Diabetes mellitus due to underlying condition with unspecified complications (HCC) 12/20/2019   Obesity (BMI 30.0-34.9) 11/09/2019   Thyroid disease    Hyperlipidemia    Obesity    Varicose veins of bilateral lower extremities with other complications    Venous insufficiency of both lower extremities    Symptomatic menopausal or female climacteric states    Environmental allergies    Elevated cholesterol     Chronic lymphatic leukemia (HCC)    Hashimoto's disease    Postoperative examination 06/19/2019   BMI 34.0-34.9,adult 05/21/2019   Calculus of gallbladder with chronic cholecystitis without obstruction 05/21/2019   Encounter for screening colonoscopy 05/21/2019   RUQ pain 05/21/2019   Special screening examination for viral disease 05/21/2019   Chest discomfort 04/2009    Past Surgical History:  Procedure Laterality Date   CHOLECYSTECTOMY  06/2019   DILATION AND CURETTAGE OF UTERUS     VEIN LIGATION AND STRIPPING  1989    OB History   No obstetric history on file.      Home Medications    Prior to Admission medications   Medication Sig Start Date End Date Taking? Authorizing Provider  albuterol (VENTOLIN HFA) 108 (90 Base) MCG/ACT inhaler Inhale 2 puffs into the lungs every 4 (four) hours as needed for wheezing or shortness of breath. 02/22/23  Yes Prescilla Sours, FNP  escitalopram (LEXAPRO) 10 MG tablet Take 10 mg by mouth daily.   Yes [provider]  oseltamivir (TAMIFLU) 75 MG capsule Take 1 capsule (75 mg total) by mouth every 12 (twelve) hours. 02/22/23  Yes Prescilla Sours, FNP  promethazine-dextromethorphan (PROMETHAZINE-DM) 6.25-15 MG/5ML syrup Take 5 mLs by mouth 4 (four) times daily as needed for cough. 02/22/23  Yes Prescilla Sours, FNP  Spacer/Aero-Holding Chambers (COMPACT SPACE CHAMBER) DEVI Use with inhaler 02/22/23  Yes Prescilla Sours, FNP  cetirizine (ZYRTEC) 10  MG tablet Take 10 mg by mouth as needed for allergies.    [provider]  ergocalciferol (VITAMIN D2) 1.25 MG (50000 UT) capsule Take 50,000 Units by mouth once a week.    [provider]  Estriol 10 % CREA Take 2 mg by mouth daily.    [provider]  fluticasone (FLONASE) 50 MCG/ACT nasal spray Place 2 sprays into both nostrils as needed for allergies or rhinitis.    [provider]  levothyroxine (SYNTHROID) 100 MCG tablet Take 100 mcg by mouth every evening.  11/15/19   [provider]  metFORMIN (GLUCOPHAGE) 500 MG tablet Take 500 mg by mouth 2 (two) times daily.    [provider]  naltrexone (DEPADE) 50 MG tablet Take 4 mg by mouth daily.     [provider]  nitroGLYCERIN (NITROSTAT) 0.4 MG SL tablet Place 1 tablet (0.4 mg total) under the tongue every 5 (five) minutes as needed for chest pain. 11/09/19 10/29/20  Revankar, Aundra Dubin, MD  PROGESTERONE PO Take by mouth.    [provider]  RABEprazole (ACIPHEX) 20 MG tablet Take 20 mg by mouth daily.      [provider]  rosuvastatin (CRESTOR) 10 MG tablet Take 1 tablet (10 mg total) by mouth daily. Patient taking differently: Take 5 mg by mouth daily. 11/30/19 10/29/20  Revankar, Aundra Dubin, MD    Family History Family History  Problem Relation Age of Onset   Cancer Mother    Arthritis Mother    Arthritis Father    Heart attack Father    Mitral valve prolapse Sister     Social History Social History   Tobacco Use   Smoking status: Never   Smokeless tobacco: Never  Vaping Use   Vaping status: Never Used  Substance Use Topics   Alcohol use: Never   Drug use: Never     Allergies   Patient has no known allergies.   Review of Systems Review of Systems  Constitutional:  Negative for chills and fever.  HENT:  Negative for ear pain and sore throat.   Eyes:  Negative for pain and visual disturbance.  Respiratory:  Positive for cough. Negative for shortness of breath.   Cardiovascular:  Negative for chest pain and palpitations.  Gastrointestinal:  Negative for abdominal pain and vomiting.  Genitourinary:  Negative for dysuria and hematuria.  Musculoskeletal:  Positive for arthralgias. Negative for back pain.  Skin:  Negative for color change and rash.  Neurological:  Negative for seizures and syncope.  All other systems reviewed and are negative.    Physical Exam Triage Vital Signs ED Triage Vitals  Encounter Vitals Group     BP  02/22/23 1600 (!) 148/68     Systolic BP Percentile --      Diastolic BP Percentile --      Pulse Rate 02/22/23 1600 68     Resp 02/22/23 1600 20     Temp 02/22/23 1600 98.6 F (37 C)     Temp Source 02/22/23 1600 Oral     SpO2 02/22/23 1600 96 %     Weight 02/22/23 1602 190 lb (86.2 kg)     Height --      Head Circumference --      Peak Flow --      Pain Score 02/22/23 1602 1     Pain Loc --      Pain Education --      Exclude from Growth Chart --  No data found.  Updated Vital Signs BP (!) 148/68 (BP Location: Right Arm)   Pulse 68   Temp 98.6 F (37 C) (Oral)   Resp 20   Wt 190 lb (86.2 kg)   SpO2 96%   BMI 34.75 kg/m   Visual Acuity Right Eye Distance:   Left Eye Distance:   Bilateral Distance:    Right Eye Near:   Left Eye Near:    Bilateral Near:     Physical Exam Vitals and nursing note reviewed.  Constitutional:      General: She is not in acute distress.    Appearance: She is well-developed. She is obese.  HENT:     Head: Normocephalic and atraumatic.     Right Ear: Hearing, tympanic membrane, ear canal and external ear normal.     Left Ear: Hearing, tympanic membrane, ear canal and external ear normal.     Nose: Congestion and rhinorrhea present. Rhinorrhea is clear.     Mouth/Throat:     Lips: Pink.     Mouth: Mucous membranes are moist.     Pharynx: Uvula midline. No oropharyngeal exudate or posterior oropharyngeal erythema.     Tonsils: No tonsillar exudate.  Eyes:     Conjunctiva/sclera: Conjunctivae normal.     Pupils: Pupils are equal, round, and reactive to light.  Cardiovascular:     Rate and Rhythm: Normal rate and regular rhythm.     Heart sounds: Normal heart sounds, S1 normal and S2 normal. No murmur heard. Pulmonary:     Effort: Pulmonary effort is normal. No respiratory distress.     Breath sounds: Examination of the right-upper field reveals wheezing. Examination of the left-upper field reveals wheezing. Wheezing (Rare and  intermittent) present. No decreased breath sounds, rhonchi or rales.  Abdominal:     Palpations: Abdomen is soft.     Tenderness: There is no abdominal tenderness.  Musculoskeletal:        General: No swelling.     Cervical back: Neck supple.  Lymphadenopathy:     Head:     Right side of head: No submental, submandibular, tonsillar, preauricular or posterior auricular adenopathy.     Left side of head: No submental, submandibular, tonsillar, preauricular or posterior auricular adenopathy.     Cervical: No cervical adenopathy.     Right cervical: No superficial cervical adenopathy.    Left cervical: No superficial cervical adenopathy.  Skin:    General: Skin is warm and dry.     Capillary Refill: Capillary refill takes less than 2 seconds.     Findings: No rash.  Neurological:     Mental Status: She is alert and oriented to person, place, and time.  Psychiatric:        Mood and Affect: Mood normal.      UC Treatments / Results  Labs (all labs ordered are listed, but only abnormal results are displayed) Labs Reviewed  POCT INFLUENZA A/B - Abnormal; Notable for the following components:      Result Value   Influenza A, POC Positive (*)    All other components within normal limits    EKG   Radiology No results found.  Procedures Procedures (including critical care time)  Medications Ordered in UC Medications - No data to display  Initial Impression / Assessment and Plan / UC Course  I have reviewed the triage vital signs and the nursing notes.  Pertinent labs & imaging results that were available during my care of the patient  were reviewed by me and considered in my medical decision making (see chart for details).  COVID and type B flu are negative.  Positive for influenza type A.  Tamiflu, 75 mg, twice daily for 7 days.  Promethazine DM, 5 mL, every 6-8 hours, as needed for cough.  Provided albuterol MDI, with spacer, 2 puffs, every 4 hours, as needed for wheezing.   Get plenty of fluids and rest.  Follow-up if symptoms do not resolve, worsen or new symptoms occur. Final Clinical Impressions(s) / UC Diagnoses   Final diagnoses:  Type A influenza  Acute cough  Wheezing     Discharge Instructions      Flu type B and COVID were negative.  Influenza type a is positive.  Get plenty of fluids and rest.  Will treat with Tamiflu, 75 mg, twice daily for 5 days.  Promethazine DM, 1 teaspoon, every 6 hours, as needed for cough.  Provided albuterol inhaler and spacer, 2 puffs, every 4 hours, as needed for wheezing.  Follow-up if symptoms do not improve, worsen or new symptoms occur.     ED Prescriptions     Medication Sig Dispense Auth. Provider   albuterol (VENTOLIN HFA) 108 (90 Base) MCG/ACT inhaler Inhale 2 puffs into the lungs every 4 (four) hours as needed for wheezing or shortness of breath. 17 each Prescilla Sours, FNP   Spacer/Aero-Holding Chambers (COMPACT SPACE CHAMBER) DEVI Use with inhaler 1 each Prescilla Sours, FNP   oseltamivir (TAMIFLU) 75 MG capsule Take 1 capsule (75 mg total) by mouth every 12 (twelve) hours. 10 capsule Prescilla Sours, FNP   promethazine-dextromethorphan (PROMETHAZINE-DM) 6.25-15 MG/5ML syrup Take 5 mLs by mouth 4 (four) times daily as needed for cough. 118 mL Prescilla Sours, FNP      PDMP not reviewed this encounter.   Prescilla Sours, FNP 02/22/23 [phone]

## 2023-03-01 ENCOUNTER — Encounter (HOSPITAL_BASED_OUTPATIENT_CLINIC_OR_DEPARTMENT_OTHER): Payer: Self-pay

## 2023-03-01 ENCOUNTER — Ambulatory Visit (HOSPITAL_BASED_OUTPATIENT_CLINIC_OR_DEPARTMENT_OTHER)
Admission: RE | Admit: 2023-03-01 | Discharge: 2023-03-01 | Disposition: A | Discharge status: Home / Self Care | Payer: Medicare PPO | Source: Ambulatory Visit | Attending: Physician Assistant | Admitting: Physician Assistant

## 2023-03-01 VITALS — BP 128/69 | HR 67 | Temp 98.3°F | Resp 20

## 2023-03-01 DIAGNOSIS — J40 Bronchitis, not specified as acute or chronic: Secondary | ICD-10-CM

## 2023-03-01 DIAGNOSIS — J329 Chronic sinusitis, unspecified: Secondary | ICD-10-CM | POA: Diagnosis not present

## 2023-03-01 MED ORDER — AMOXICILLIN-POT CLAVULANATE 875-125 MG PO TABS: 1.0000 | ORAL_TABLET | Freq: Two times a day (BID) | ORAL | 0 refills | Status: DC

## 2023-03-01 MED ORDER — METHYLPREDNISOLONE ACETATE 80 MG/ML IJ SUSP
60.0000 mg | Freq: Once | INTRAMUSCULAR | Status: AC
  Administered 2023-03-01: 60 mg via INTRAMUSCULAR

## 2023-03-01 NOTE — ED Provider Notes (Signed)
[name]    CSN: [id number] Arrival date & time: 03/01/23  1332      History   Chief Complaint Chief Complaint  Patient presents with   Sore Throat   Cough    HPI Michele King is a 68 y.o. female.   Patient presents today with a week and a half long history of URI symptoms.  She was seen by our clinic on 02/22/2023 at which point she was diagnosed with influenza and given Tamiflu, Promethazine DM, albuterol.  She reports taking these medications but continues to have significant cough with associated congestion and sinus drainage.  She denies any fever, shortness of breath, nausea, vomiting.  She has been taking ibuprofen occasionally for sinus headaches but has not been taking additional over-the-counter medications.  She denies any recent antibiotics or steroids.  She denies history of asthma, allergies, COPD.  She is prediabetic with her last A1c 6.2%.      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 05/21/2019   Chest discomfort 3/11   Chronic lymphatic leukemia (HCC)    Elevated cholesterol    Encounter for screening colonoscopy 05/21/2019   Environmental allergies    Hashimoto's disease    Hyperlipidemia    Obesity    Obesity (BMI 30.0-34.9) 11/09/2019   Postoperative examination 06/19/2019   RUQ pain 05/21/2019   Special screening examination for viral disease 05/21/2019   Symptomatic menopausal or female climacteric states    Thyroid disease    hypothyroidism   Varicose veins of bilateral lower extremities with other complications    Venous insufficiency of both lower extremities     Patient Active Problem List   Diagnosis Date Noted   History of colonic polyps 02/11/2020   Diabetes mellitus due to underlying condition with unspecified complications (HCC) 12/20/2019   Obesity (BMI 30.0-34.9) 11/09/2019   Thyroid disease    Hyperlipidemia     Obesity    Varicose veins of bilateral lower extremities with other complications    Venous insufficiency of both lower extremities    Symptomatic menopausal or female climacteric states    Environmental allergies    Elevated cholesterol    Chronic lymphatic leukemia (HCC)    Hashimoto's disease    Postoperative examination 06/19/2019   BMI 34.0-34.9,adult 05/21/2019   Calculus of gallbladder with chronic cholecystitis without obstruction 05/21/2019   Encounter for screening colonoscopy 05/21/2019   RUQ pain 05/21/2019   Special screening examination for viral disease 05/21/2019   Chest discomfort 04/2009    Past Surgical History:  Procedure Laterality Date   CHOLECYSTECTOMY  06/2019   DILATION AND CURETTAGE OF UTERUS     VEIN LIGATION AND STRIPPING  1989    OB History   No obstetric history on file.      Home Medications    Prior to Admission medications   Medication Sig Start Date End Date Taking? Authorizing Provider  amoxicillin-clavulanate (AUGMENTIN) 875-125 MG tablet Take 1 tablet by mouth every 12 (twelve) hours. 03/01/23  Yes Peirce Deveney K, PA-C  albuterol (VENTOLIN HFA) 108 (90 Base) MCG/ACT inhaler Inhale 2 puffs into the lungs every 4 (four) hours as needed for wheezing or shortness of breath. 02/22/23   Prescilla Sours, FNP  cetirizine (ZYRTEC) 10 MG tablet Take 10 mg by mouth as needed for allergies.    [provider]  ergocalciferol (VITAMIN D2) 1.25 MG (50000 UT) capsule Take 50,000  Units by mouth once a week.    [provider]  escitalopram (LEXAPRO) 10 MG tablet Take 10 mg by mouth daily.    [provider]  Estriol 10 % CREA Take 2 mg by mouth daily.    [provider]  fluticasone (FLONASE) 50 MCG/ACT nasal spray Place 2 sprays into both nostrils as needed for allergies or rhinitis.    [provider]  levothyroxine (SYNTHROID) 100 MCG tablet Take 100 mcg by mouth every evening. 11/15/19   [provider]  metFORMIN (GLUCOPHAGE) 500 MG tablet Take 500 mg by mouth 2 (two) times daily.    [provider]  naltrexone (DEPADE) 50 MG tablet Take 4 mg by mouth daily.     [provider]  nitroGLYCERIN (NITROSTAT) 0.4 MG SL tablet Place 1 tablet (0.4 mg total) under the tongue every 5 (five) minutes as needed for chest pain. 11/09/19 10/29/20  Revankar, Aundra Dubin, MD  oseltamivir (TAMIFLU) 75 MG capsule Take 1 capsule (75 mg total) by mouth every 12 (twelve) hours. 02/22/23   Prescilla Sours, FNP  PROGESTERONE PO Take by mouth.    [provider]  promethazine-dextromethorphan (PROMETHAZINE-DM) 6.25-15 MG/5ML syrup Take 5 mLs by mouth 4 (four) times daily as needed for cough. 02/22/23   Prescilla Sours, FNP  RABEprazole (ACIPHEX) 20 MG tablet Take 20 mg by mouth daily.      [provider]  rosuvastatin (CRESTOR) 10 MG tablet Take 1 tablet (10 mg total) by mouth daily. Patient taking differently: Take 5 mg by mouth daily. 11/30/19 10/29/20  Revankar, Aundra Dubin, MD  Spacer/Aero-Holding Chambers (COMPACT SPACE CHAMBER) DEVI Use with inhaler 02/22/23   Prescilla Sours, FNP    Family History Family History  Problem Relation Age of Onset   Cancer Mother    Arthritis Mother    Arthritis Father    Heart attack Father    Mitral valve prolapse Sister     Social History Social History   Tobacco Use   Smoking status: Never   Smokeless tobacco: Never  Vaping Use   Vaping status: Never Used  Substance Use Topics   Alcohol use: Never   Drug use: Never     Allergies   Patient has no known allergies.   Review of Systems Review of Systems  Constitutional:  Positive for activity change. Negative for appetite change, fatigue and fever.  HENT:  Positive for congestion, postnasal drip and sinus pressure. Negative for sneezing and sore throat.   Respiratory:  Positive for cough. Negative for shortness of breath.   Cardiovascular:  Negative for chest pain.  Gastrointestinal:   Negative for abdominal pain, diarrhea, nausea and vomiting.  Neurological:  Positive for headaches. Negative for dizziness and light-headedness.     Physical Exam Triage Vital Signs ED Triage Vitals  Encounter Vitals Group     BP 03/01/23 1338 128/69     Systolic BP Percentile --      Diastolic BP Percentile --      Pulse Rate 03/01/23 1338 67     Resp 03/01/23 1338 20     Temp 03/01/23 1338 98.3 F (36.8 C)     Temp Source 03/01/23 1338 Oral     SpO2 03/01/23 1338 97 %     Weight --      Height --      Head Circumference --      Peak Flow --      Pain Score 03/01/23 1339 0  Pain Loc --      Pain Education --      Exclude from Growth Chart --    No data found.  Updated Vital Signs BP 128/69 (BP Location: Right Arm)   Pulse 67   Temp 98.3 F (36.8 C) (Oral)   Resp 20   SpO2 97%   Visual Acuity Right Eye Distance:   Left Eye Distance:   Bilateral Distance:    Right Eye Near:   Left Eye Near:    Bilateral Near:     Physical Exam Vitals reviewed.  Constitutional:      General: She is awake. She is not in acute distress.    Appearance: Normal appearance. She is well-developed. She is not ill-appearing.     Comments: Very pleasant female appears stated age in no acute distress sitting comfortably in exam room  HENT:     Head: Normocephalic and atraumatic.     Right Ear: Tympanic membrane, ear canal and external ear normal. Tympanic membrane is not erythematous or bulging.     Left Ear: Tympanic membrane, ear canal and external ear normal. Tympanic membrane is not erythematous or bulging.     Nose:     Right Sinus: Maxillary sinus tenderness present. No frontal sinus tenderness.     Left Sinus: Maxillary sinus tenderness present. No frontal sinus tenderness.     Mouth/Throat:     Pharynx: Uvula midline. Postnasal drip present. No oropharyngeal exudate or posterior oropharyngeal erythema.  Cardiovascular:     Rate and Rhythm: Normal rate and regular rhythm.      Heart sounds: Normal heart sounds, S1 normal and S2 normal. No murmur heard. Pulmonary:     Effort: Pulmonary effort is normal.     Breath sounds: Normal breath sounds. No wheezing, rhonchi or rales.     Comments: Reactive cough deep breathing Psychiatric:        Behavior: Behavior is cooperative.      UC Treatments / Results  Labs (all labs ordered are listed, but only abnormal results are displayed) Labs Reviewed - No data to display  EKG   Radiology No results found.  Procedures Procedures (including critical care time)  Medications Ordered in UC Medications  methylPREDNISolone acetate (DEPO-MEDROL) injection 60 mg (60 mg Intramuscular Given 03/01/23 1356)    Initial Impression / Assessment and Plan / UC Course  I have reviewed the triage vital signs and the nursing notes.  Pertinent labs & imaging results that were available during my care of the patient were reviewed by me and considered in my medical decision making (see chart for details).     Patient is well-appearing, afebrile, nontoxic, nontachycardic.  Chest x-ray was deferred as she had no adventitious lung sounds on exam and oxygen saturation was 97%.  Concern for secondary bacterial infection given prolonged and recent worsening of symptoms will start Augmentin twice daily for 7 days.  No indication for dose adjustment based on metabolic panel from 10/13/2022 with creatinine of 0.70 and calculated creatinine clearance of 106.1 mL/min.  We discussed potential utility of steroids to help with her symptoms and offered prednisone prescription.  Reports that this is previously upset her stomach so she preferred injection and was therefore given 60 mg of Depo-Medrol.  Discussed that she can continue previously prescribed medications including albuterol and Promethazine DM.  She can use over-the-counter medication including Mucinex, Flonase, nasal saline/sinus rinses.  Recommend she rest and drink plenty of fluid.  If her  symptoms are not  improving within 5 to 7 days or if anything worsens and she has high fever, worsening cough, shortness of breath, nausea/vomiting interfering with oral intake she needs to be seen immediately.  Strict return precautions given.  All questions answered to patient satisfaction.  Final Clinical Impressions(s) / UC Diagnoses   Final diagnoses:  Sinobronchitis     Discharge Instructions      Start Augmentin twice daily for 7 days.  Use over-the-counter medication including Mucinex, Flonase, nasal saline/sinus rinses.  Make sure you rest and drink plenty of fluid.  We gave an injection of steroids today but should hopefully help with your symptoms.  If your symptoms are not improving within 5 to 7 days or if anything worsens and you have high fever, worsening cough, shortness of breath, chest pain, nausea/vomiting interfering with oral intake you need to be seen immediately.     ED Prescriptions     Medication Sig Dispense Auth. Provider   amoxicillin-clavulanate (AUGMENTIN) 875-125 MG tablet Take 1 tablet by mouth every 12 (twelve) hours. 14 tablet Vann Okerlund, Noberto Retort, PA-C      PDMP not reviewed this encounter.   Michele Hawking, PA-C 03/01/23 1357

## 2023-03-01 NOTE — ED Triage Notes (Signed)
Seen 1/21 and tested for covid/flu. Tested positive for influenza A. Given Tamiflu. States cough was productive and now dry cough. Sore throat continues. Reports woke up today with swelling to both eyes.

## 2023-03-01 NOTE — Discharge Instructions (Signed)
Start Augmentin twice daily for 7 days.  Use over-the-counter medication including Mucinex, Flonase, nasal saline/sinus rinses.  Make sure you rest and drink plenty of fluid.  We gave an injection of steroids today but should hopefully help with your symptoms.  If your symptoms are not improving within 5 to 7 days or if anything worsens and you have high fever, worsening cough, shortness of breath, chest pain, nausea/vomiting interfering with oral intake you need to be seen immediately.

## 2023-03-02 DIAGNOSIS — J329 Chronic sinusitis, unspecified: Secondary | ICD-10-CM | POA: Diagnosis not present

## 2023-03-02 DIAGNOSIS — R059 Cough, unspecified: Secondary | ICD-10-CM | POA: Diagnosis not present

## 2023-03-02 DIAGNOSIS — L309 Dermatitis, unspecified: Secondary | ICD-10-CM | POA: Diagnosis not present

## 2023-03-02 DIAGNOSIS — Z6835 Body mass index (BMI) 35.0-35.9, adult: Secondary | ICD-10-CM | POA: Diagnosis not present

## 2023-03-02 DIAGNOSIS — J40 Bronchitis, not specified as acute or chronic: Secondary | ICD-10-CM | POA: Diagnosis not present

## 2023-03-20 ENCOUNTER — Ambulatory Visit (HOSPITAL_BASED_OUTPATIENT_CLINIC_OR_DEPARTMENT_OTHER)
Admission: RE | Admit: 2023-03-20 | Discharge: 2023-03-20 | Disposition: A | Discharge status: Home / Self Care | Payer: Medicare PPO | Source: Ambulatory Visit | Attending: Family Medicine | Admitting: Family Medicine

## 2023-03-20 ENCOUNTER — Encounter (HOSPITAL_BASED_OUTPATIENT_CLINIC_OR_DEPARTMENT_OTHER): Payer: Self-pay

## 2023-03-20 VITALS — BP 147/84 | HR 75 | Temp 98.3°F | Resp 20

## 2023-03-20 DIAGNOSIS — J209 Acute bronchitis, unspecified: Secondary | ICD-10-CM | POA: Diagnosis not present

## 2023-03-20 DIAGNOSIS — R300 Dysuria: Secondary | ICD-10-CM

## 2023-03-20 DIAGNOSIS — N3090 Cystitis, unspecified without hematuria: Secondary | ICD-10-CM | POA: Diagnosis not present

## 2023-03-20 DIAGNOSIS — R350 Frequency of micturition: Secondary | ICD-10-CM | POA: Diagnosis present

## 2023-03-20 DIAGNOSIS — N3091 Cystitis, unspecified with hematuria: Secondary | ICD-10-CM | POA: Diagnosis not present

## 2023-03-20 DIAGNOSIS — R059 Cough, unspecified: Secondary | ICD-10-CM | POA: Diagnosis present

## 2023-03-20 LAB — POCT URINALYSIS DIP (MANUAL ENTRY)
Bilirubin, UA: NEGATIVE
Glucose, UA: NEGATIVE mg/dL
Ketones, POC UA: NEGATIVE mg/dL
Nitrite, UA: NEGATIVE
Protein Ur, POC: NEGATIVE mg/dL
Spec Grav, UA: <=1.005 — AB (ref 1.010–1.025)
Urobilinogen, UA: 0.2 U/dL
pH, UA: 5.5 (ref 5.0–8.0)

## 2023-03-20 MED ORDER — FLUCONAZOLE 150 MG PO TABS: 150.0000 mg | ORAL_TABLET | ORAL | 0 refills | Status: AC

## 2023-03-20 MED ORDER — PROMETHAZINE-DM 6.25-15 MG/5ML PO SYRP: 5.0000 mL | ORAL_SOLUTION | Freq: Four times a day (QID) | ORAL | 0 refills | Status: DC | PRN

## 2023-03-20 MED ORDER — LEVOFLOXACIN 500 MG PO TABS: 500.0000 mg | ORAL_TABLET | Freq: Every day | ORAL | 0 refills | Status: AC

## 2023-03-20 MED ORDER — PREDNISONE 20 MG PO TABS: 40.0000 mg | ORAL_TABLET | Freq: Every day | ORAL | 0 refills | Status: AC

## 2023-03-20 NOTE — Discharge Instructions (Addendum)
The urinalysis had a small amount of white blood cells and red blood cells; this is consistent with a bladder infection.  Urine culture is sent, and staff will notify you that it looks like the antibiotic needs to be changed.   Levaquin 500 mg--take 1 tablet daily for 7 days  Take prednisone 20 mg--2 daily for 5 days; this is for inflammation in your lungs  Take Phenergan with dextromethorphan syrup--5 mL or 1 teaspoon every 6 hours as needed for cough  Take fluconazole 150 mg--1 tablet every 3 days for 2 dose, if you start having signs of a yeast infection.

## 2023-03-20 NOTE — ED Triage Notes (Signed)
Seen 1/21 and 1/28 for cough. Treated for Influenza. Has seen pcp as well for sinus infection. Pat reports cough since 1/21.  Also having dysuria. States visible blood in urine today.

## 2023-03-20 NOTE — ED Provider Notes (Signed)
[facility]    CSN: [id number] Arrival date & time: 03/20/23  1417      History   Chief Complaint Chief Complaint  Patient presents with   Cough   Dysuria    HPI [name] is a 68 y.o. female.    Cough Dysuria Here for dysuria and hematuria and urinary frequency.  The symptoms started this morning.  No fever or vomiting.  Michele King has also had prolonged cough and congestion.  Michele King has been seen here January 21 and 28 and also has been seen by her primary.  Michele King has been treated for influenza and then was treated for a sinus infection with Augmentin also.  The inhaler has been helping sometimes but in the last few days Michele King has had coughing spasms where it is hard to get a breath in while it happens.  NKDA  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 05/21/2019   Chest discomfort 3/11   Chronic lymphatic leukemia (HCC)    Elevated cholesterol    Encounter for screening colonoscopy 05/21/2019   Environmental allergies    Hashimoto's disease    Hyperlipidemia    Obesity    Obesity (BMI 30.0-34.9) 11/09/2019   Postoperative examination 06/19/2019   RUQ pain 05/21/2019   Special screening examination for viral disease 05/21/2019   Symptomatic menopausal or female climacteric states    Thyroid disease    hypothyroidism   Varicose veins of bilateral lower extremities with other complications    Venous insufficiency of both lower extremities     Patient Active Problem List   Diagnosis Date Noted   History of colonic polyps 02/11/2020   Diabetes mellitus due to underlying condition with unspecified complications (HCC) 12/20/2019   Obesity (BMI 30.0-34.9) 11/09/2019   Thyroid disease    Hyperlipidemia    Obesity    Varicose veins of bilateral lower extremities with other complications    Venous insufficiency of both lower extremities    Symptomatic  menopausal or female climacteric states    Environmental allergies    Elevated cholesterol    Chronic lymphatic leukemia (HCC)    Hashimoto's disease    Postoperative examination 06/19/2019   BMI 34.0-34.9,adult 05/21/2019   Calculus of gallbladder with chronic cholecystitis without obstruction 05/21/2019   Encounter for screening colonoscopy 05/21/2019   RUQ pain 05/21/2019   Special screening examination for viral disease 05/21/2019   Chest discomfort 04/2009    Past Surgical History:  Procedure Laterality Date   CHOLECYSTECTOMY  06/2019   DILATION AND CURETTAGE OF UTERUS     VEIN LIGATION AND STRIPPING  1989    OB History   No obstetric history on file.      Home Medications    Prior to Admission medications   Medication Sig Start Date End Date Taking? Authorizing Provider  fluconazole (DIFLUCAN) 150 MG tablet Take 1 tablet (150 mg total) by mouth every 3 (three) days for 2 doses. 03/20/23 03/24/23 Yes Zenia Resides, MD  levofloxacin (LEVAQUIN) 500 MG tablet Take 1 tablet (500 mg total) by mouth daily for 7 days. 03/20/23 03/27/23 Yes Otis Portal, Janace Aris, MD  predniSONE (DELTASONE) 20 MG tablet Take 2 tablets (40 mg total) by mouth daily with breakfast for 5 days. 03/20/23 03/25/23 Yes Rmani Kapusta, Janace Aris, MD  albuterol (VENTOLIN HFA) 108 (90 Base) MCG/ACT inhaler Inhale 2 puffs into the lungs every 4 (four) hours as needed  for wheezing or shortness of breath. 02/22/23   Prescilla Sours, FNP  cetirizine (ZYRTEC) 10 MG tablet Take 10 mg by mouth as needed for allergies.    [provider]  ergocalciferol (VITAMIN D2) 1.25 MG (50000 UT) capsule Take 50,000 Units by mouth once a week.    [provider]  escitalopram (LEXAPRO) 10 MG tablet Take 10 mg by mouth daily.    [provider]  Estriol 10 % CREA Take 2 mg by mouth daily.    [provider]  fluticasone (FLONASE) 50 MCG/ACT nasal spray Place 2 sprays into both nostrils as needed for  allergies or rhinitis.    [provider]  levothyroxine (SYNTHROID) 100 MCG tablet Take 100 mcg by mouth every evening. 11/15/19   [provider]  metFORMIN (GLUCOPHAGE) 500 MG tablet Take 500 mg by mouth 2 (two) times daily.    [provider]  naltrexone (DEPADE) 50 MG tablet Take 4 mg by mouth daily.     [provider]  nitroGLYCERIN (NITROSTAT) 0.4 MG SL tablet Place 1 tablet (0.4 mg total) under the tongue every 5 (five) minutes as needed for chest pain. 11/09/19 10/29/20  Revankar, Aundra Dubin, MD  PROGESTERONE PO Take by mouth.    [provider]  promethazine-dextromethorphan (PROMETHAZINE-DM) 6.25-15 MG/5ML syrup Take 5 mLs by mouth 4 (four) times daily as needed for cough. 03/20/23   Zenia Resides, MD  RABEprazole (ACIPHEX) 20 MG tablet Take 20 mg by mouth daily.      [provider]  rosuvastatin (CRESTOR) 10 MG tablet Take 1 tablet (10 mg total) by mouth daily. Patient taking differently: Take 5 mg by mouth daily. 11/30/19 10/29/20  Revankar, Aundra Dubin, MD  Spacer/Aero-Holding Chambers (COMPACT SPACE CHAMBER) DEVI Use with inhaler 02/22/23   Prescilla Sours, FNP    Family History Family History  Problem Relation Age of Onset   Cancer Mother    Arthritis Mother    Arthritis Father    Heart attack Father    Mitral valve prolapse Sister     Social History Social History   Tobacco Use   Smoking status: Never   Smokeless tobacco: Never  Vaping Use   Vaping status: Never Used  Substance Use Topics   Alcohol use: Never   Drug use: Never     Allergies   Patient has no known allergies.   Review of Systems Review of Systems  Respiratory:  Positive for cough.   Genitourinary:  Positive for dysuria.     Physical Exam Triage Vital Signs ED Triage Vitals  Encounter Vitals Group     BP 03/20/23 1500 (!) 147/84     Systolic BP Percentile --      Diastolic BP Percentile --      Pulse Rate 03/20/23 1500 75     Resp  03/20/23 1500 20     Temp 03/20/23 1500 98.3 F (36.8 C)     Temp Source 03/20/23 1500 Oral     SpO2 03/20/23 1500 98 %     Weight --      Height --      Head Circumference --      Peak Flow --      Pain Score 03/20/23 1502 4     Pain Loc --      Pain Education --      Exclude from Growth Chart --    No data found.  Updated Vital Signs BP (!) 147/84 (BP Location:  Right Arm)   Pulse 75   Temp 98.3 F (36.8 C) (Oral)   Resp 20   SpO2 98%   Visual Acuity Right Eye Distance:   Left Eye Distance:   Bilateral Distance:    Right Eye Near:   Left Eye Near:    Bilateral Near:     Physical Exam Vitals reviewed.  Constitutional:      General: Michele King is not in acute distress.    Appearance: Michele King is not ill-appearing, toxic-appearing or diaphoretic.  HENT:     Right Ear: Tympanic membrane and ear canal normal.     Left Ear: Tympanic membrane and ear canal normal.     Nose: Nose normal.     Mouth/Throat:     Mouth: Mucous membranes are moist.     Pharynx: No oropharyngeal exudate or posterior oropharyngeal erythema.  Eyes:     Extraocular Movements: Extraocular movements intact.     Conjunctiva/sclera: Conjunctivae normal.     Pupils: Pupils are equal, round, and reactive to light.  Cardiovascular:     Rate and Rhythm: Normal rate and regular rhythm.     Heart sounds: No murmur heard. Pulmonary:     Effort: No respiratory distress.     Breath sounds: No stridor. No wheezing, rhonchi or rales.  Musculoskeletal:     Cervical back: Neck supple.  Lymphadenopathy:     Cervical: No cervical adenopathy.  Skin:    Capillary Refill: Capillary refill takes less than 2 seconds.     Coloration: Skin is not jaundiced or pale.  Neurological:     General: No focal deficit present.     Mental Status: Michele King is alert and oriented to person, place, and time.  Psychiatric:        Behavior: Behavior normal.      UC Treatments / Results  Labs (all labs ordered are listed, but only  abnormal results are displayed) Labs Reviewed  POCT URINALYSIS DIP (MANUAL ENTRY) - Abnormal; Notable for the following components:      Result Value   Color, UA light yellow (*)    Spec Grav, UA <=1.005 (*)    Blood, UA small (*)    Leukocytes, UA Small (1+) (*)    All other components within normal limits  URINE CULTURE    EKG   Radiology No results found.  Procedures Procedures (including critical care time)  Medications Ordered in UC Medications - No data to display  Initial Impression / Assessment and Plan / UC Course  I have reviewed the triage vital signs and the nursing notes.  Pertinent labs & imaging results that were available during my care of the patient were reviewed by me and considered in my medical decision making (see chart for details).   Michele King does have some tight cough in the room.  No wheezing on exam.  Urinalysis is consistent with UTI with a small amount of leukocytes and red blood cells.  Levaquin is sent in to treat the UTI as that would also be a good respiratory medicine.  Prednisone is sent in for 5 days.  Michele King had only had a steroid injection when treated first.  Also more Phenergan and dextromethorphan is sent in and Diflucan is sent in in case Michele King gets a yeast infection with her antibiotics. Final Clinical Impressions(s) / UC Diagnoses   Final diagnoses:  Dysuria  Cystitis  Acute bronchitis, unspecified organism     Discharge Instructions      The urinalysis had a  small amount of white blood cells and red blood cells; this is consistent with a bladder infection.  Urine culture is sent, and staff will notify you that it looks like the antibiotic needs to be changed.   Levaquin 500 mg--take 1 tablet daily for 7 days  Take prednisone 20 mg--2 daily for 5 days; this is for inflammation in your lungs  Take Phenergan with dextromethorphan syrup--5 mL or 1 teaspoon every 6 hours as needed for cough  Take fluconazole 150 mg--1 tablet every 3  days for 2 dose, if you start having signs of a yeast infection.      ED Prescriptions     Medication Sig Dispense Auth. Provider   promethazine-dextromethorphan (PROMETHAZINE-DM) 6.25-15 MG/5ML syrup Take 5 mLs by mouth 4 (four) times daily as needed for cough. 118 mL Zenia Resides, MD   levofloxacin (LEVAQUIN) 500 MG tablet Take 1 tablet (500 mg total) by mouth daily for 7 days. 7 tablet Nubia Ziesmer, Janace Aris, MD   predniSONE (DELTASONE) 20 MG tablet Take 2 tablets (40 mg total) by mouth daily with breakfast for 5 days. 10 tablet Zenia Resides, MD   fluconazole (DIFLUCAN) 150 MG tablet Take 1 tablet (150 mg total) by mouth every 3 (three) days for 2 doses. 2 tablet Marlinda Mike Janace Aris, MD      PDMP not reviewed this encounter.   Zenia Resides, MD 03/20/23 [phone]

## 2023-03-22 LAB — URINE CULTURE: Culture: 60000 — AB

## 2023-03-31 DIAGNOSIS — E2839 Other primary ovarian failure: Secondary | ICD-10-CM | POA: Diagnosis not present

## 2023-04-26 DIAGNOSIS — E119 Type 2 diabetes mellitus without complications: Secondary | ICD-10-CM | POA: Diagnosis not present

## 2023-04-26 DIAGNOSIS — H524 Presbyopia: Secondary | ICD-10-CM | POA: Diagnosis not present

## 2023-04-26 DIAGNOSIS — Z973 Presence of spectacles and contact lenses: Secondary | ICD-10-CM | POA: Diagnosis not present

## 2023-05-26 ENCOUNTER — Other Ambulatory Visit: Payer: Self-pay | Admitting: Oncology

## 2023-05-26 DIAGNOSIS — C9110 Chronic lymphocytic leukemia of B-cell type not having achieved remission: Secondary | ICD-10-CM

## 2023-05-26 NOTE — Progress Notes (Signed)
 [facility]  [address] [place],  [place]  [address] [phone]  Clinic Day:  05/27/2023  Referring physician: Eliezer Groves, NP  HISTORY OF PRESENT ILLNESS:  The patient is a 68 y.o. female with chronic lymphocytic leukemia, diagnosed per flow cytometry of her peripheral blood in August 2013.  Despite her white count being high over these past years, it had held fairly stable over her past visits without her other cell lines being affected by her disease.  She comes in today for routine follow up.  Since her last visit, the patient has been doing fine.  She continues to deny having any B symptoms or bulky lymphadenopathy which concerns her for catabolic progression of her CLL.   PHYSICAL EXAM:  Blood pressure (!) 128/59, pulse 68, temperature 98.4 F (36.9 C), temperature source Oral, resp. rate 16, height 5\' 2"  (1.575 m), weight 192 lb 11.2 oz (87.4 kg), SpO2 95%. Wt Readings from Last 3 Encounters:  05/27/23 192 lb 11.2 oz (87.4 kg)  02/22/23 190 lb (86.2 kg)  11/26/22 188 lb 11.2 oz (85.6 kg)   Body mass index is 35.25 kg/m. Performance status (ECOG): 0 - Asymptomatic Physical Exam Constitutional:      Appearance: Normal appearance. She is not ill-appearing.  HENT:     Mouth/Throat:     Mouth: Mucous membranes are moist.     Pharynx: Oropharynx is clear. No oropharyngeal exudate or posterior oropharyngeal erythema.  Cardiovascular:     Rate and Rhythm: Normal rate and regular rhythm.     Heart sounds: No murmur heard.    No friction rub. No gallop.  Pulmonary:     Effort: Pulmonary effort is normal. No respiratory distress.     Breath sounds: Normal breath sounds. No wheezing, rhonchi or rales.  Abdominal:     General: Bowel sounds are normal. There is no distension.     Palpations: Abdomen is soft. There is no mass.     Tenderness: There is no abdominal tenderness.  Musculoskeletal:        General: No swelling.     Right lower leg:  No edema.     Left lower leg: No edema.  Lymphadenopathy:     Cervical: No cervical adenopathy.     Upper Body:     Right upper body: No supraclavicular or axillary adenopathy.     Left upper body: No supraclavicular or axillary adenopathy.     Lower Body: No right inguinal adenopathy. No left inguinal adenopathy.  Skin:    General: Skin is warm.     Coloration: Skin is not jaundiced.     Findings: No lesion or rash.  Neurological:     General: No focal deficit present.     Mental Status: She is alert and oriented to person, place, and time. Mental status is at baseline.  Psychiatric:        Mood and Affect: Mood normal.        Behavior: Behavior normal.        Thought Content: Thought content normal.    LABS:    Latest Reference Range & Units 05/27/23 14:37  WBC 4.0 - 10.5 K/uL 88.2 (HH)  RBC 3.87 - 5.11 MIL/uL 4.51  Hemoglobin 12.0 - 15.0 g/dL 91.4 (L)  HCT 78.2 - 95.6 % 38.0  MCV 80.0 - 100.0 fL 84.3  MCH 26.0 - 34.0 pg 26.2  MCHC 30.0 - 36.0 g/dL 21.3  RDW 08.6 - 57.8 % 14.7  Platelets 150 - 400 K/uL 188  nRBC 0.0 - 0.2 % 0 /100 WBC 0.00 0  Neutrophils % 6  Lymphocytes % 92  Monocytes Relative % 2  Eosinophil % 0  Basophil % 0  Immature Granulocytes % 0  (HH): Data is critically high (L): Data is abnormally low  Latest Reference Range & Units 05/27/23 14:37  Iron 28 - 170 ug/dL 37  UIBC ug/dL 161  TIBC 096 - 045 ug/dL 409 (H)  Saturation Ratios 10.4 - 31.8 % 8 (L)  Ferritin 11 - 307 ng/mL 12  Folate >5.9 ng/mL 11.0  Vitamin B12 180 - 914 pg/mL 278  (H): Data is abnormally high (L): Data is abnormally low   ASSESSMENT & PLAN:  Assessment/Plan:  A 68 y.o. female with chronic lymphocytic leukemia.  When comparing her labs today to what they have been in the past, her elevated white count is essentially unchanged versus her last visit.  She is mildly anemic today.  Her labs today do show that she is iron deficient.  Based upon this, I will arrange for her to  receive IV iron over the next few weeks to replenish her iron stores and improve her hemoglobin.  I will see her back in 3 months to see how well she responded to her upcoming IV iron.  The patient understands all the plans discussed today and is in agreement with them.    Savino Whisenant Felicia Horde, MD

## 2023-05-27 ENCOUNTER — Other Ambulatory Visit: Payer: Self-pay | Admitting: Oncology

## 2023-05-27 ENCOUNTER — Other Ambulatory Visit: Payer: Self-pay

## 2023-05-27 ENCOUNTER — Inpatient Hospital Stay: Payer: Medicare PPO | Attending: Oncology

## 2023-05-27 ENCOUNTER — Inpatient Hospital Stay: Payer: Medicare PPO | Admitting: Oncology

## 2023-05-27 ENCOUNTER — Telehealth: Payer: Self-pay

## 2023-05-27 VITALS — BP 128/59 | HR 68 | Temp 98.4°F | Resp 16 | Ht 62.0 in | Wt 192.7 lb

## 2023-05-27 DIAGNOSIS — D509 Iron deficiency anemia, unspecified: Secondary | ICD-10-CM | POA: Diagnosis not present

## 2023-05-27 DIAGNOSIS — C9110 Chronic lymphocytic leukemia of B-cell type not having achieved remission: Secondary | ICD-10-CM

## 2023-05-27 DIAGNOSIS — D508 Other iron deficiency anemias: Secondary | ICD-10-CM | POA: Diagnosis not present

## 2023-05-27 LAB — CBC WITH DIFFERENTIAL (CANCER CENTER ONLY)
Abs Immature Granulocytes: 0 10*3/uL (ref 0.00–0.07)
Basophils Absolute: 0 10*3/uL (ref 0.0–0.1)
Basophils Relative: 0 %
Eosinophils Absolute: 0 10*3/uL (ref 0.0–0.5)
Eosinophils Relative: 0 %
HCT: 38 % (ref 36.0–46.0)
Hemoglobin: 11.8 g/dL — ABNORMAL LOW (ref 12.0–15.0)
Immature Granulocytes: 0 %
Lymphocytes Relative: 92 %
Lymphs Abs: 81.1 10*3/uL — ABNORMAL HIGH (ref 0.7–4.0)
MCH: 26.2 pg (ref 26.0–34.0)
MCHC: 31.1 g/dL (ref 30.0–36.0)
MCV: 84.3 fL (ref 80.0–100.0)
Monocytes Absolute: 1.8 10*3/uL — ABNORMAL HIGH (ref 0.1–1.0)
Monocytes Relative: 2 %
Neutro Abs: 5.3 10*3/uL (ref 1.7–7.7)
Neutrophils Relative %: 6 %
Platelet Count: 188 10*3/uL (ref 150–400)
RBC: 4.51 MIL/uL (ref 3.87–5.11)
RDW: 14.7 % (ref 11.5–15.5)
Smear Review: NORMAL
WBC Count: 88.2 10*3/uL (ref 4.0–10.5)
nRBC: 0 % (ref 0.0–0.2)
nRBC: 0 /100{WBCs}

## 2023-05-27 LAB — VITAMIN B12: Vitamin B-12: 278 pg/mL (ref 180–914)

## 2023-05-27 LAB — IRON AND TIBC
Iron: 37 ug/dL (ref 28–170)
Saturation Ratios: 8 % — ABNORMAL LOW (ref 10.4–31.8)
TIBC: 452 ug/dL — ABNORMAL HIGH (ref 250–450)
UIBC: 415 ug/dL

## 2023-05-27 LAB — FOLATE: Folate: 11 ng/mL

## 2023-05-27 LAB — FERRITIN: Ferritin: 12 ng/mL (ref 11–307)

## 2023-05-27 NOTE — Telephone Encounter (Signed)
 CRITICAL VALUE STICKER  CRITICAL VALUE: WBC 88.2  RECEIVER (on-site recipient of call): Shelvy Dickens, LPN  DATE & TIME NOTIFIED: 05/27/23 AT1457  MESSENGER (representative from lab):KIMBERLY AT [facility]  MD NOTIFIED: DR. Harles Lied  TIME OF NOTIFICATION: 1458  RESPONSE: PATIENT IS A CLL PATIENT

## 2023-06-03 DIAGNOSIS — G4733 Obstructive sleep apnea (adult) (pediatric): Secondary | ICD-10-CM | POA: Diagnosis not present

## 2023-06-06 ENCOUNTER — Other Ambulatory Visit: Payer: Self-pay | Admitting: Oncology

## 2023-06-06 DIAGNOSIS — D508 Other iron deficiency anemias: Secondary | ICD-10-CM

## 2023-06-06 DIAGNOSIS — D509 Iron deficiency anemia, unspecified: Secondary | ICD-10-CM | POA: Insufficient documentation

## 2023-06-08 ENCOUNTER — Telehealth: Payer: Self-pay

## 2023-06-08 ENCOUNTER — Telehealth: Payer: Self-pay | Admitting: Oncology

## 2023-06-08 NOTE — Telephone Encounter (Signed)
 Dr Harles Lied: let pt know labs on 4-25 showed her to be iron deficient. Have her set up for IV iron asap.  Latest Reference Range & Units 05/27/23 14:37  Iron 28 - 170 ug/dL 37  UIBC ug/dL 578  TIBC 469 - 629 ug/dL 528 (H)  Saturation Ratios 10.4 - 31.8 % 8 (L)  Ferritin 11 - 307 ng/mL 12  Folate >5.9 ng/mL 11.0  Vitamin B12 180 - 914 pg/mL 278  (H): Data is abnormally high (L): Data is abnormally low

## 2023-06-08 NOTE — Telephone Encounter (Signed)
 Patient has been scheduled. Aware of appt dates and times.    Scheduling Message Entered by [place], AMY W on 06/08/2023 at 10:04 AM Priority: High INFUSION 1HR30MIN (90)  Department: [facility]  Provider: Deloria Fetch, MD  Appointment Notes:  needs IV iron

## 2023-06-10 ENCOUNTER — Ambulatory Visit

## 2023-06-10 ENCOUNTER — Inpatient Hospital Stay: Attending: Oncology

## 2023-06-10 VITALS — BP 130/55 | HR 70 | Temp 98.0°F | Resp 18

## 2023-06-10 DIAGNOSIS — E611 Iron deficiency: Secondary | ICD-10-CM | POA: Diagnosis not present

## 2023-06-10 DIAGNOSIS — D508 Other iron deficiency anemias: Secondary | ICD-10-CM

## 2023-06-10 MED ORDER — IRON SUCROSE 20 MG/ML IV SOLN
200.0000 mg | Freq: Once | INTRAVENOUS | Status: AC
Start: 2023-06-10 — End: 2023-06-10
  Administered 2023-06-10: 200 mg via INTRAVENOUS
  Filled 2023-06-10: qty 10

## 2023-06-10 NOTE — Patient Instructions (Signed)

## 2023-06-13 ENCOUNTER — Ambulatory Visit

## 2023-06-13 ENCOUNTER — Inpatient Hospital Stay

## 2023-06-13 VITALS — BP 124/54 | HR 65 | Temp 98.9°F | Resp 18

## 2023-06-13 MED ORDER — IRON SUCROSE 20 MG/ML IV SOLN: 200.0000 mg | Freq: Once | INTRAVENOUS | Status: DC

## 2023-06-13 NOTE — Patient Instructions (Signed)

## 2023-06-13 NOTE — Progress Notes (Signed)
 Pt stated she had severe chest pain over the weekend. She had EMS come to evaluate her and she stated that she was fine. She does have acid reflux but has never had this pain before. It started on Friday night, today she is feeling well. Vitals stable. Discussed with Albertha Huger, pharmacy about holding iron today and having her see her primary care doctor before proceeding with iron treatments.  0845 sent pt home to follow up with primary care doctor, Dr. Harles Lied made aware and scheduling. Pt will call to reschedule appts.

## 2023-06-14 ENCOUNTER — Inpatient Hospital Stay

## 2023-06-14 ENCOUNTER — Ambulatory Visit

## 2023-06-15 ENCOUNTER — Inpatient Hospital Stay

## 2023-06-15 ENCOUNTER — Ambulatory Visit

## 2023-06-16 ENCOUNTER — Ambulatory Visit

## 2023-06-16 ENCOUNTER — Inpatient Hospital Stay

## 2023-06-16 DIAGNOSIS — L609 Nail disorder, unspecified: Secondary | ICD-10-CM | POA: Diagnosis not present

## 2023-06-16 DIAGNOSIS — I890 Lymphedema, not elsewhere classified: Secondary | ICD-10-CM | POA: Diagnosis not present

## 2023-06-16 DIAGNOSIS — E1169 Type 2 diabetes mellitus with other specified complication: Secondary | ICD-10-CM | POA: Diagnosis not present

## 2023-06-16 DIAGNOSIS — R1084 Generalized abdominal pain: Secondary | ICD-10-CM | POA: Diagnosis not present

## 2023-06-16 DIAGNOSIS — E7800 Pure hypercholesterolemia, unspecified: Secondary | ICD-10-CM | POA: Diagnosis not present

## 2023-06-16 DIAGNOSIS — C9110 Chronic lymphocytic leukemia of B-cell type not having achieved remission: Secondary | ICD-10-CM | POA: Diagnosis not present

## 2023-06-16 DIAGNOSIS — I10 Essential (primary) hypertension: Secondary | ICD-10-CM | POA: Diagnosis not present

## 2023-06-16 DIAGNOSIS — F331 Major depressive disorder, recurrent, moderate: Secondary | ICD-10-CM | POA: Diagnosis not present

## 2023-06-16 DIAGNOSIS — E038 Other specified hypothyroidism: Secondary | ICD-10-CM | POA: Diagnosis not present

## 2023-06-20 DIAGNOSIS — R1084 Generalized abdominal pain: Secondary | ICD-10-CM | POA: Diagnosis not present

## 2023-06-20 DIAGNOSIS — E7800 Pure hypercholesterolemia, unspecified: Secondary | ICD-10-CM | POA: Diagnosis not present

## 2023-06-20 DIAGNOSIS — E559 Vitamin D deficiency, unspecified: Secondary | ICD-10-CM | POA: Diagnosis not present

## 2023-06-20 DIAGNOSIS — R7989 Other specified abnormal findings of blood chemistry: Secondary | ICD-10-CM | POA: Diagnosis not present

## 2023-06-20 DIAGNOSIS — R5383 Other fatigue: Secondary | ICD-10-CM | POA: Diagnosis not present

## 2023-06-20 DIAGNOSIS — E1169 Type 2 diabetes mellitus with other specified complication: Secondary | ICD-10-CM | POA: Diagnosis not present

## 2023-06-20 DIAGNOSIS — E063 Autoimmune thyroiditis: Secondary | ICD-10-CM | POA: Diagnosis not present

## 2023-06-20 DIAGNOSIS — R635 Abnormal weight gain: Secondary | ICD-10-CM | POA: Diagnosis not present

## 2023-06-20 DIAGNOSIS — Z79899 Other long term (current) drug therapy: Secondary | ICD-10-CM | POA: Diagnosis not present

## 2023-06-23 DIAGNOSIS — C9110 Chronic lymphocytic leukemia of B-cell type not having achieved remission: Secondary | ICD-10-CM | POA: Diagnosis not present

## 2023-06-23 DIAGNOSIS — R1084 Generalized abdominal pain: Secondary | ICD-10-CM | POA: Diagnosis not present

## 2023-06-29 NOTE — Therapy (Addendum)
 OUTPATIENT PHYSICAL THERAPY  LOWER EXTREMITY ONCOLOGY EVALUATION  Patient Name: Michele King MRN: [id number] DOB:1955/07/01, 68 y.o., female Today's Date: 06/30/2023  END OF SESSION:  PT End of Session - 06/30/23 1158     Visit Number 1    Number of Visits 5    Date for PT Re-Evaluation 07/28/23    PT Start Time 1106    PT Stop Time 1158    PT Time Calculation (min) 52 min    Activity Tolerance Patient tolerated treatment well    Behavior During Therapy [facility] for tasks assessed/performed             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 05/21/2019   Chest discomfort 3/11   Chronic lymphatic leukemia (HCC)    Elevated cholesterol    Encounter for screening colonoscopy 05/21/2019   Environmental allergies    Hashimoto's disease    Hyperlipidemia    Obesity    Obesity (BMI 30.0-34.9) 11/09/2019   Postoperative examination 06/19/2019   RUQ pain 05/21/2019   Special screening examination for viral disease 05/21/2019   Symptomatic menopausal or female climacteric states    Thyroid  disease    hypothyroidism   Varicose veins of bilateral lower extremities with other complications    Venous insufficiency of both lower extremities    Past Surgical History:  Procedure Laterality Date   CHOLECYSTECTOMY  06/2019   DILATION AND CURETTAGE OF UTERUS     VEIN LIGATION AND STRIPPING  1989   Patient Active Problem List   Diagnosis Date Noted   Iron  deficiency anemia 06/06/2023   History of colonic polyps 02/11/2020   Diabetes mellitus due to underlying condition with unspecified complications (HCC) 12/20/2019   Obesity (BMI 30.0-34.9) 11/09/2019   Thyroid  disease    Hyperlipidemia    Obesity    Varicose veins of bilateral lower extremities with other complications    Venous insufficiency of both lower extremities    Symptomatic menopausal or female climacteric states     Environmental allergies    Elevated cholesterol    Chronic lymphatic leukemia (HCC)    Hashimoto's disease    Postoperative examination 06/19/2019   BMI 34.0-34.9,adult 05/21/2019   Calculus of gallbladder with chronic cholecystitis without obstruction 05/21/2019   Encounter for screening colonoscopy 05/21/2019   RUQ pain 05/21/2019   Special screening examination for viral disease 05/21/2019   Chest discomfort 04/2009    PCP:   REFERRING PROVIDER: Aleck Milch, MD  REFERRING DIAG: LE lymphedema  THERAPY DIAG:  Lymphedema, not elsewhere classified  ONSET DATE: several years ago, mainly in the summer,2 weeks ago became worse  Rationale for Evaluation and Treatment: Rehabilitation  SUBJECTIVE:  SUBJECTIVE STATEMENT:  In the summer time her legs would swell occasionally. She has been swollen every day now for 2 weeks. Her legs reduce overnight most of the way. A CT on 06/23/23 did show swollen lymph nodes in bilateral groin region which they felt was related to her leukemia.  She has worn compression stockings in the past which she bought at [facility] in [place]. Pt notes when swollen her legs may be tender and her ankles feel stiff, and  like her balance is off .  PERTINENT HISTORY:  Pt with a history of Chronic Lymphocitic Leukemia since 2013, with elevated white counts which have remained fairly stable. No signs of lymphadenopathy when last seen on 05/27/2023. Bilateral LE swelling right greater than left with legs getting progressively worse. They are addressing recent fatigue with iron  infusions. She has  varicose veins with venous insufficiency. CLL, Type II DM, B. Varicose veins, elevated cholesterol and hypertension, Hashimoto's disease  PAIN:  Are you having pain? No  PRECAUTIONS: CLL,  Type II DM, B. Varicose veins, elevated cholesterol , LE lymphedema,and hypertension, anxiety  RED FLAGS: None   WEIGHT BEARING RESTRICTIONS: No  FALLS:  Has patient fallen in last 6 months? No  LIVING ENVIRONMENT: Lives with: lives alone Lives in: House/apartment Stairs: No; External: 1 steps; none  OCCUPATION: retired from Agricultural consultant, church musician  LEISURE: reading,  PRIOR LEVEL OF FUNCTION: Independent  PATIENT GOALS: reduce swelling, manage swelling   OBJECTIVE: Note: Objective measures were completed at Evaluation unless otherwise noted.  COGNITION: Overall cognitive status: Within functional limits for tasks assessed   PALPATION: Mild to mod pitting edema bilateral lower legs around ankles and slightly above  OBSERVATIONS / OTHER ASSESSMENTS: mild swelling bilateral lower extremities below the knees  SENSATION: Light touch: Appears intact    POSTURE: forward head, rounded shoulders  LOWER EXTREMITY STRENGTH:  MMT Right eval  Hip flexion   Hip extension   Hip abduction   Hip adduction   Hip internal rotation   Hip external rotation   Knee flexion   Knee extension   Ankle dorsiflexion   Ankle plantarflexion   Ankle inversion   Ankle eversion   Great toe extension    (Blank rows = not tested)  MMT LEFT eval  Hip flexion   Hip extension   Hip abduction   Hip adduction   Hip internal rotation   Hip external rotation   Knee flexion   Knee extension   Ankle dorsiflexion   Ankle plantarflexion   Ankle inversion   Ankle eversion   Great toe extension     (Blank rows = not tested)  LYMPHEDEMA ASSESSMENTS:   SURGERY TYPE/DATE: NA  NUMBER OF LYMPH NODES REMOVED: 0  LYMPHEDEMA ASSESSMENTS:   LOWER EXTREMITY LANDMARK RIGHT eval  At groin   30 cm proximal to suprapatella   20 cm proximal to suprapatella   10 cm proximal to suprapatella 48.5  At midpatella / popliteal crease 34.9  30 cm proximal to floor at lateral plantar foot 36.4   20 cm proximal to floor at lateral plantar foot 28.3  10 cm proximal to floor at lateral plantar foot 21.8  Circumference of ankle/heel   5 cm proximal to 1st MTP joint   Across MTP joint 20.1  Around proximal great toe 6.6  (Blank rows = not tested)  LOWER EXTREMITY LANDMARK LEFT eval  At groin   30 cm proximal to suprapatella   20 cm proximal to suprapatella   10 cm  proximal to suprapatella 48.4  At midpatella / popliteal crease 34.4  30 cm proximal to floor at lateral plantar foot 37  20 cm proximal to floor at lateral plantar foot 28.7  10 cm proximal to floor at lateral plantar foot 22.2  Circumference of ankle/heel   5 cm proximal to 1st MTP joint   Across MTP joint 19.5  Around proximal great toe 7.1  (Blank rows = not tested)  FUNCTIONAL TESTS:    GAIT:WNL   Outcome measure:Lymphedema life impact scale:17.65                                                                                                                            TREATMENT DATE:  06/30/2023 Discussed options for treatment of lymphedema.including bandaging/stockings/ night garments, MLD. Pts legs with minimal swelling and should do fine with a class 1 stocking. Showed her Slipeze for donning aid, and gave her [employer] website for options. Pt has been to [facility] in [place] and we discussed different classes of stockings and open vs closed toe. Discussed starting with class 1 and importance of donning first thing in the am, and removing before bed.  Should could start with a milder stocking if she wishes, however, if it does not hold swelling she requires stronger. Discussed stockings at other places would be more expensive, but may last longer. Should last close to 6 months with proper care, but may need to replace [facility] stockings more frequently. Still worth trying [facility] since she lives close and has used them before. Advised pt to contact me with questions or concerns, and to check in with me after she gets  her stockings so we can discuss and see if she needs to return for several more visits.   PATIENT EDUCATION:  Education details: see above Person educated: Patient Education method: Explanation Education comprehension: verbalized understanding  HOME EXERCISE PROGRAM: NA : gave pt paper with class 1, 20-30 mmhg written on it, and also with compression [employer]uru for options, but should contact me with questions  ASSESSMENT:  CLINICAL IMPRESSION: Patient is a 68 y.o. female who was seen today for physical therapy evaluation and treatment for Bilateral LE lymphedema right greater than left.. It is am and swelling appears mild presently, howevr there is some pitting and mild fibrosis distally. She has varicose veins and this is likely venous insufficiency.  Pt was educated in use of elevation and  we discussed options for treatment of lymphedema.including bandaging/stockings/ night garments, MLD. Pts legs with minimal swelling and should do fine with a class 1 stocking. Showed her Slipeze for donning aid, and gave her [employer] website for options. Pt has been to [facility] in [place] and we discussed different classes of stockings and open vs closed toe. Discussed starting with class 1 and importance of donning first thing in the am, and removing before bed.  Should could start with a milder stocking if she wishes, however, if it does not hold  swelling she requires stronger. Discussed stockings at other places would be more expensive, but may last longer. Should last close to 6 months with proper care, but may need to replace [facility] stockings more frequently. Still worth trying [facility] since she lives close and has used them before. Advised pt to contact me with questions or concerns, and to check in with me after she gets her stockings so we can discuss and see if she needs to return for several more visits.   OBJECTIVE IMPAIRMENTS: decreased balance, difficulty walking, increased edema, and postural  dysfunction.   ACTIVITY LIMITATIONS: standing and locomotion level  PARTICIPATION LIMITATIONS: prolonged walking  PERSONAL FACTORS: Age and 1-2 comorbidities: CLL, venous insufficiency are also affecting patient's functional outcome.   REHAB POTENTIAL: Good  CLINICAL DECISION MAKING: Stable/uncomplicated  EVALUATION COMPLEXITY: Low   GOALS: Goals reviewed with patient? Yes  SHORT TERM GOALS= LONG TERM GOALS: Target date:   Pt will have appropriate compression garments to maintain swelling Baseline: Goal status: INITIAL  2.  Pt will be able to don stockings independently Baseline:  Goal status: INITIAL   PLAN:  PT FREQUENCY: 1-2x/week  PT DURATION: 4 weeks, or 4 visits  PLANNED INTERVENTIONS: 02835- PT Re-evaluation, 97535- Self Care, 02859- Manual therapy, [phone]- Orthotic Initial, and Patient/Family education  PLAN FOR NEXT SESSION: check stocking and advise on proper way to don, measure  legs, wearing consistently, any furhter treatment required ie bandaging, night garment? PHYSICAL THERAPY DISCHARGE SUMMARY  Visits from Start of Care: 1  Current functional level related to goals / functional outcomes: Per pt phone message she received her garment and is doing well   Remaining deficits: None noted   Education / Equipment: Pt was to purchase new compression stocking stronger than her previous.  She purchased one at [facility] and is happy with the result.   Patient agrees to discharge. Patient goals were met. Patient is being discharged due to being pleased with the current functional level.   Grayce JINNY Sheldon, PT 06/30/2023, 6:30 PM

## 2023-06-30 ENCOUNTER — Ambulatory Visit: Attending: Internal Medicine

## 2023-06-30 ENCOUNTER — Other Ambulatory Visit: Payer: Self-pay

## 2023-06-30 DIAGNOSIS — I890 Lymphedema, not elsewhere classified: Secondary | ICD-10-CM | POA: Insufficient documentation

## 2023-07-06 DIAGNOSIS — D49512 Neoplasm of unspecified behavior of left kidney: Secondary | ICD-10-CM | POA: Diagnosis not present

## 2023-07-07 DIAGNOSIS — E1169 Type 2 diabetes mellitus with other specified complication: Secondary | ICD-10-CM | POA: Diagnosis not present

## 2023-07-07 DIAGNOSIS — I10 Essential (primary) hypertension: Secondary | ICD-10-CM | POA: Diagnosis not present

## 2023-07-07 DIAGNOSIS — R7989 Other specified abnormal findings of blood chemistry: Secondary | ICD-10-CM | POA: Diagnosis not present

## 2023-07-07 DIAGNOSIS — R748 Abnormal levels of other serum enzymes: Secondary | ICD-10-CM | POA: Diagnosis not present

## 2023-07-07 DIAGNOSIS — C642 Malignant neoplasm of left kidney, except renal pelvis: Secondary | ICD-10-CM | POA: Diagnosis not present

## 2023-07-07 DIAGNOSIS — E785 Hyperlipidemia, unspecified: Secondary | ICD-10-CM | POA: Diagnosis not present

## 2023-07-07 DIAGNOSIS — K219 Gastro-esophageal reflux disease without esophagitis: Secondary | ICD-10-CM | POA: Diagnosis not present

## 2023-07-07 DIAGNOSIS — E039 Hypothyroidism, unspecified: Secondary | ICD-10-CM | POA: Diagnosis not present

## 2023-07-07 DIAGNOSIS — B351 Tinea unguium: Secondary | ICD-10-CM | POA: Diagnosis not present

## 2023-07-08 ENCOUNTER — Other Ambulatory Visit: Payer: Self-pay | Admitting: Urology

## 2023-07-15 ENCOUNTER — Other Ambulatory Visit (HOSPITAL_BASED_OUTPATIENT_CLINIC_OR_DEPARTMENT_OTHER): Payer: Self-pay | Admitting: Urology

## 2023-07-15 DIAGNOSIS — D49512 Neoplasm of unspecified behavior of left kidney: Secondary | ICD-10-CM

## 2023-07-22 ENCOUNTER — Ambulatory Visit (HOSPITAL_BASED_OUTPATIENT_CLINIC_OR_DEPARTMENT_OTHER)
Admission: RE | Admit: 2023-07-22 | Discharge: 2023-07-22 | Disposition: A | Discharge status: Home / Self Care | Source: Ambulatory Visit | Attending: Urology | Admitting: Urology

## 2023-07-22 DIAGNOSIS — D49512 Neoplasm of unspecified behavior of left kidney: Secondary | ICD-10-CM

## 2023-07-22 DIAGNOSIS — R590 Localized enlarged lymph nodes: Secondary | ICD-10-CM | POA: Diagnosis not present

## 2023-08-09 DIAGNOSIS — D49512 Neoplasm of unspecified behavior of left kidney: Secondary | ICD-10-CM | POA: Diagnosis not present

## 2023-08-16 NOTE — Patient Instructions (Addendum)
 SURGICAL WAITING ROOM VISITATION  Patients having surgery or a procedure may have no more than 2 support people in the waiting area - these visitors may rotate.    Children under the age of 28 must have an adult with them who is not the patient.  Visitors with respiratory illnesses are discouraged from visiting and should remain at home.  If the patient needs to stay at the hospital during part of their recovery, the visitor guidelines for inpatient rooms apply. Pre-op nurse will coordinate an appropriate time for 1 support person to accompany patient in pre-op.  This support person may not rotate.    Please refer to the [facility] website for the visitor guidelines for Inpatients (after your surgery is over and you are in a regular room).       Your procedure is scheduled on: 08/24/23   Report to [facility] Main Entrance    Report to admitting at 8:45 AM   Call this number if you have problems the morning of surgery [phone]   Do not eat food or drink liquids :After Midnight.but may have sips of water  with meds.    FOLLOW BOWEL PREP AND ANY ADDITIONAL PRE OP INSTRUCTIONS YOU RECEIVED FROM YOUR SURGEON'S OFFICE!!!     Oral Hygiene is also important to reduce your risk of infection.                                    Remember - BRUSH YOUR TEETH THE MORNING OF SURGERY WITH YOUR REGULAR TOOTHPASTE  DENTURES WILL BE REMOVED PRIOR TO SURGERY PLEASE DO NOT APPLY Poly grip OR ADHESIVES!!!   Stop all vitamins and herbal supplements 7 days before surgery.   Take these medicines the morning of surgery with A SIP OF WATER :  Aciphex Escitaprolam(lexapro ) Estradiol (estrace ) Levothyroxine  Rosuvastatin  Okay to use nasal spray and eyedrops  DO NOT TAKE ANY ORAL DIABETIC MEDICATIONS DAY OF YOUR SURGERY Hold Metformin the morning of surgery.  Bring CPAP mask and tubing day of surgery.                              You may not have any metal on your body including hair  pins, jewelry, and body piercing             Do not wear make-up, lotions, powders, perfumes/cologne, or deodorant  Do not wear nail polish including gel and S&S, artificial/acrylic nails, or any other type of covering on natural nails including finger and toenails. If you have artificial nails, gel coating, etc. that needs to be removed by a nail salon please have this removed prior to surgery or surgery may need to be canceled/ delayed if the surgeon/ anesthesia feels like they are unable to be safely monitored.   Do not shave  48 hours prior to surgery.    Do not bring valuables to the hospital. [facility] IS NOT  RESPONSIBLE   FOR VALUABLES.   Contacts, glasses, dentures or bridgework may not be worn into surgery.   Bring small overnight bag day of surgery.   DO NOT BRING YOUR HOME MEDICATIONS TO THE HOSPITAL. PHARMACY WILL DISPENSE MEDICATIONS LISTED ON YOUR MEDICATION LIST TO YOU DURING YOUR ADMISSION IN THE HOSPITAL!                Please read over the following fact sheets you  were given: IF YOU HAVE QUESTIONS ABOUT YOUR PRE-OP INSTRUCTIONS PLEASE CALL [phone] Lakshmi   If you received a COVID test during your pre-op visit  it is requested that you wear a mask when out in public, stay away from anyone that may not be feeling well and notify your surgeon if you develop symptoms. If you test positive for Covid or have been in contact with anyone that has tested positive in the last 10 days please notify you surgeon.    [facility] - Preparing for Surgery Before surgery, you can play an important role.  Because skin is not sterile, your skin needs to be as free of germs as possible.  You can reduce the number of germs on your skin by washing with CHG (chlorahexidine gluconate) soap before surgery.  CHG is an antiseptic cleaner which kills germs and bonds with the skin to continue killing germs even after washing. Please DO NOT use if you have an allergy to CHG or antibacterial soaps.   If your skin becomes reddened/irritated stop using the CHG and inform your nurse when you arrive at Short Stay. Do not shave (including legs and underarms) for at least 48 hours prior to the first CHG shower.  You may shave your face/neck.  Please follow these instructions carefully:  1.  Shower with CHG Soap the night before surgery and the  morning of surgery.  2.  If you choose to wash your hair, wash your hair first as usual with your normal  shampoo.  3.  After you shampoo, rinse your hair and body thoroughly to remove the shampoo.                             4.  Use CHG as you would any other liquid soap.  You can apply chg directly to the skin and wash.  Gently with a scrungie or clean washcloth.  5.  Apply the CHG Soap to your body ONLY FROM THE NECK DOWN.   Do   not use on face/ open                           Wound or open sores. Avoid contact with eyes, ears mouth and   genitals (private parts).                       Wash face,  Genitals (private parts) with your normal soap.             6.  Wash thoroughly, paying special attention to the area where your    surgery  will be performed.  7.  Thoroughly rinse your body with warm water  from the neck down.  8.  DO NOT shower/wash with your normal soap after using and rinsing off the CHG Soap.                9.  Pat yourself dry with a clean towel.            10.  Wear clean pajamas.            11.  Place clean sheets on your bed the night of your first shower and do not  sleep with pets. Day of Surgery : Do not apply any lotions/deodorants the morning of surgery.  Please wear clean clothes to the hospital/surgery center.  FAILURE TO FOLLOW THESE INSTRUCTIONS MAY RESULT  IN THE CANCELLATION OF YOUR SURGERY  ________________________________________________________________________ WHAT IS A BLOOD TRANSFUSION? Blood Transfusion Information  A transfusion is the replacement of blood or some of its parts. Blood is made up of multiple cells  which provide different functions. Red blood cells carry oxygen and are used for blood loss replacement. White blood cells fight against infection. Platelets control bleeding. Plasma helps clot blood. Other blood products are available for specialized needs, such as hemophilia or other clotting disorders. BEFORE THE TRANSFUSION  Who gives blood for transfusions?  Healthy volunteers who are fully evaluated to make sure their blood is safe. This is blood bank blood. Transfusion therapy is the safest it has ever been in the practice of medicine. Before blood is taken from a donor, a complete history is taken to make sure that person has no history of diseases nor engages in risky social behavior (examples are intravenous drug use or sexual activity with multiple partners). The donor's travel history is screened to minimize risk of transmitting infections, such as malaria. The donated blood is tested for signs of infectious diseases, such as HIV and hepatitis. The blood is then tested to be sure it is compatible with you in order to minimize the chance of a transfusion reaction. If you or a relative donates blood, this is often done in anticipation of surgery and is not appropriate for emergency situations. It takes many days to process the donated blood. RISKS AND COMPLICATIONS Although transfusion therapy is very safe and saves many lives, the main dangers of transfusion include:  Getting an infectious disease. Developing a transfusion reaction. This is an allergic reaction to something in the blood you were given. Every precaution is taken to prevent this. The decision to have a blood transfusion has been considered carefully by your caregiver before blood is given. Blood is not given unless the benefits outweigh the risks. AFTER THE TRANSFUSION Right after receiving a blood transfusion, you will usually feel much better and more energetic. This is especially true if your red blood cells have gotten  low (anemic). The transfusion raises the level of the red blood cells which carry oxygen, and this usually causes an energy increase. The nurse administering the transfusion will monitor you carefully for complications. HOME CARE INSTRUCTIONS  No special instructions are needed after a transfusion. You may find your energy is better. Speak with your caregiver about any limitations on activity for underlying diseases you may have. SEEK MEDICAL CARE IF:  Your condition is not improving after your transfusion. You develop redness or irritation at the intravenous (IV) site. SEEK IMMEDIATE MEDICAL CARE IF:  Any of the following symptoms occur over the next 12 hours: Shaking chills. You have a temperature by mouth above 102 F (38.9 C), not controlled by medicine. Chest, back, or muscle pain. People around you feel you are not acting correctly or are confused. Shortness of breath or difficulty breathing. Dizziness and fainting. You get a rash or develop hives. You have a decrease in urine output. Your urine turns a dark color or changes to pink, red, or brown. Any of the following symptoms occur over the next 10 days: You have a temperature by mouth above 102 F (38.9 C), not controlled by medicine. Shortness of breath. Weakness after normal activity. The white part of the eye turns yellow (jaundice). You have a decrease in the amount of urine or are urinating less often. Your urine turns a dark color or changes to pink, red, or brown. Document Released:  01/16/2000 Document Revised: 04/12/2011 Document Reviewed: 09/04/2007 [facility] Patient Information 2014 [place], [place].

## 2023-08-16 NOTE — Progress Notes (Signed)
 COVID Vaccine received:  []  No []  Yes Date of any COVID positive Test in last 90 days:  PCP - Dr. Aleck Milch Cardiologist -   Chest x-ray -  EKG -   Stress Test - 11/27/19 Epic ECHO -  Cardiac Cath -   Bowel Prep - []  No  []   Yes ______  Pacemaker / ICD device []  No []  Yes   Spinal Cord Stimulator:[]  No []  Yes       History of Sleep Apnea? []  No []  Yes   CPAP used?- []  No []  Yes    Does the patient monitor blood sugar?          []  No []  Yes  []  N/A  Patient has: []  NO Hx DM   []  Pre-DM                 []  DM1  []   DM2 Does patient have a [employer] or [employer]? []  No []  Yes   Fasting Blood Sugar Ranges-  Checks Blood Sugar _____ times a day  GLP1 agonist / usual dose -  GLP1 instructions:  SGLT-2 inhibitors / usual dose -  SGLT-2 instructions:   Blood Thinner / Instructions: Aspirin Instructions:  Comments:   Activity level: Patient is able / unable to climb a flight of stairs without difficulty; []  No CP  []  No SOB, but would have ___   Patient can / can not perform ADLs without assistance.   Anesthesia review:   Patient denies shortness of breath, fever, cough and chest pain at PAT appointment.  Patient verbalized understanding and agreement to the Pre-Surgical Instructions that were given to them at this PAT appointment. Patient was also educated of the need to review these PAT instructions again prior to his/her surgery.I reviewed the appropriate phone numbers to call if they have any and questions or concerns.

## 2023-08-22 ENCOUNTER — Encounter (HOSPITAL_COMMUNITY): Payer: Self-pay | Admitting: *Deleted

## 2023-08-22 ENCOUNTER — Encounter (HOSPITAL_COMMUNITY)
Admission: RE | Admit: 2023-08-22 | Discharge: 2023-08-22 | Disposition: A | Discharge status: Home / Self Care | Source: Ambulatory Visit | Attending: Urology | Admitting: Urology

## 2023-08-22 ENCOUNTER — Other Ambulatory Visit: Payer: Self-pay

## 2023-08-22 VITALS — BP 127/62 | HR 63 | Temp 98.5°F | Resp 16 | Ht 61.5 in | Wt 189.2 lb

## 2023-08-22 DIAGNOSIS — F419 Anxiety disorder, unspecified: Secondary | ICD-10-CM | POA: Insufficient documentation

## 2023-08-22 DIAGNOSIS — I872 Venous insufficiency (chronic) (peripheral): Secondary | ICD-10-CM | POA: Diagnosis not present

## 2023-08-22 DIAGNOSIS — E088 Diabetes mellitus due to underlying condition with unspecified complications: Secondary | ICD-10-CM | POA: Diagnosis not present

## 2023-08-22 DIAGNOSIS — K219 Gastro-esophageal reflux disease without esophagitis: Secondary | ICD-10-CM | POA: Diagnosis not present

## 2023-08-22 DIAGNOSIS — Z01818 Encounter for other preprocedural examination: Secondary | ICD-10-CM | POA: Insufficient documentation

## 2023-08-22 DIAGNOSIS — D649 Anemia, unspecified: Secondary | ICD-10-CM | POA: Diagnosis not present

## 2023-08-22 DIAGNOSIS — D49512 Neoplasm of unspecified behavior of left kidney: Secondary | ICD-10-CM | POA: Diagnosis not present

## 2023-08-22 DIAGNOSIS — I10 Essential (primary) hypertension: Secondary | ICD-10-CM | POA: Insufficient documentation

## 2023-08-22 DIAGNOSIS — E119 Type 2 diabetes mellitus without complications: Secondary | ICD-10-CM

## 2023-08-22 DIAGNOSIS — Z7984 Long term (current) use of oral hypoglycemic drugs: Secondary | ICD-10-CM | POA: Diagnosis not present

## 2023-08-22 DIAGNOSIS — M1990 Unspecified osteoarthritis, unspecified site: Secondary | ICD-10-CM | POA: Insufficient documentation

## 2023-08-22 DIAGNOSIS — N2889 Other specified disorders of kidney and ureter: Secondary | ICD-10-CM | POA: Insufficient documentation

## 2023-08-22 HISTORY — DX: Anxiety disorder, unspecified: F41.9

## 2023-08-22 HISTORY — DX: Unspecified osteoarthritis, unspecified site: M19.90

## 2023-08-22 HISTORY — DX: Essential (primary) hypertension: I10

## 2023-08-22 HISTORY — DX: Anemia, unspecified: D64.9

## 2023-08-22 HISTORY — DX: Hypothyroidism, unspecified: E03.9

## 2023-08-22 HISTORY — DX: Gastro-esophageal reflux disease without esophagitis: K21.9

## 2023-08-22 HISTORY — DX: Type 2 diabetes mellitus without complications: E11.9

## 2023-08-22 HISTORY — DX: Personal history of urinary calculi: Z87.442

## 2023-08-22 LAB — CBC
HCT: 42 % (ref 36.0–46.0)
Hemoglobin: 12.6 g/dL (ref 12.0–15.0)
MCH: 26.1 pg (ref 26.0–34.0)
MCHC: 30 g/dL (ref 30.0–36.0)
MCV: 87 fL (ref 80.0–100.0)
Platelets: 176 K/uL (ref 150–400)
RBC: 4.83 MIL/uL (ref 3.87–5.11)
RDW: 15.3 % (ref 11.5–15.5)
WBC: 71.5 K/uL (ref 4.0–10.5)
nRBC: 0 % (ref 0.0–0.2)

## 2023-08-22 LAB — BASIC METABOLIC PANEL WITH GFR
Anion gap: 6 (ref 5–15)
BUN: 9 mg/dL (ref 8–23)
CO2: 29 mmol/L (ref 22–32)
Calcium: 8.8 mg/dL — ABNORMAL LOW (ref 8.9–10.3)
Chloride: 100 mmol/L (ref 98–111)
Creatinine, Ser: 0.7 mg/dL (ref 0.44–1.00)
GFR, Estimated: >60 mL/min (ref >60)
Glucose, Bld: 93 mg/dL (ref 70–99)
Potassium: 5.3 mmol/L — ABNORMAL HIGH (ref 3.5–5.1)
Sodium: 135 mmol/L (ref 135–145)

## 2023-08-22 LAB — HEMOGLOBIN A1C
Hgb A1c MFr Bld: 6.1 % — ABNORMAL HIGH (ref 4.8–5.6)
Mean Plasma Glucose: 128.37 mg/dL

## 2023-08-22 LAB — GLUCOSE, CAPILLARY: Glucose-Capillary: 108 mg/dL — ABNORMAL HIGH (ref 70–99)

## 2023-08-22 NOTE — Progress Notes (Addendum)
 COVID Vaccine Completed:  Date of COVID positive in last 90 days:  No  PCP - Aleck Milch, MD Cardiologist - Jennifer Cramp, MD [phone])  Chest x-ray - N/A EKG - 08-22-23 Epic Stress Test - 11-27-19 Epic ECHO - N/A Cardiac Cath - N/A Pacemaker/ICD device last checked:N/A Spinal Cord Stimulator: N/A  Bowel Prep - N/A  Sleep Study - Yes, +sleep apnea CPAP - Yes  Fasting Blood Sugar -  Checks Blood Sugar - does not check   Last dose of GLP1 agonist-  N/A GLP1 instructions:  Do not take after     Last dose of SGLT-2 inhibitors-  N/A SGLT-2 instructions:  Do not take after    Blood Thinner Instructions:  N/A: Aspirin Instructions: Last Dose:  Activity level:  Can go up a flight of stairs and perform activities of daily living without stopping and without symptoms of chest pain or shortness of breath.  Anesthesia review:  Chest discomfort and hyperlipdemia evaluated by cardiology in 2021.  Patient states no recent episodes of chest discomfort.      HTN, DM, hx of leukemia.  WBC 71.5 on preop labs  Patient denies shortness of breath, fever, cough and chest pain at PAT appointment  Patient verbalized understanding of instructions that were given to them at the PAT appointment. Patient was also instructed that they will need to review over the PAT instructions again at home before surgery.

## 2023-08-23 ENCOUNTER — Encounter (HOSPITAL_COMMUNITY): Payer: Self-pay

## 2023-08-23 NOTE — Progress Notes (Signed)
 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 PMH of hypertension, venous insufficiency, GERD, CLL, hypothyroidism, diabetes (A1c 6.1), anxiety, arthritis, anemia  Patient with left renal mass found incidentally on CT scan.  Now scheduled for surgery above.  Patient with history of CLL, followed by oncology.  Last seen on 05/27/2023 for surveillance labs which were stable at her last visit.  She was also noted to be mildly anemic and received IV iron  infusion.  Patient evaluated by cardiology in 2021 due to chest pain.  CT calcium  score showed mild CAD and she had stress testing done which was normal.  Advised to follow-up with PCP  VS: BP 127/62   Pulse 63   Temp 36.9 C (Oral)   Resp 16   Ht 5' 1.5 (1.562 m)   Wt 85.8 kg   SpO2 98%   BMI 35.17 kg/m   PROVIDERS: Jefferey Fitch, MD   LABS: Labs reviewed: Acceptable for surgery.  WBC stable.  Mild hyperkalemia (all labs ordered are listed, but only abnormal results are displayed)  Labs Reviewed  CBC - Abnormal; Notable for the following components:      Result Value   WBC 71.5 (*)    All other components within normal limits  BASIC METABOLIC PANEL WITH GFR - Abnormal; Notable for the following components:   Potassium 5.3 (*)    Calcium  8.8 (*)    All other components within normal limits  HEMOGLOBIN A1C - Abnormal; Notable for the following components:   Hgb A1c MFr Bld 6.1 (*)    All other components within normal limits  GLUCOSE, CAPILLARY - Abnormal; Notable for the following components:   Glucose-Capillary 108 (*)    All other components  within normal limits  TYPE AND SCREEN     IMAGES: CT chest 07/22/2023:  IMPRESSION: 1. No evidence of metastatic disease in the chest. 2. Minimal calcified plaque in the LAD. 3. Suggestion of mild dilatation of the main pulmonary artery measuring up to approximately 3.2 cm. This can be seen in the setting of pulmonary arterial hypertension. 4. Stable mildly prominent bilateral axillary lymph nodes which demonstrate normal morphology with fatty hila. 5. Stable appearance of probable mild splenomegaly.  EKG 08/22/2023:  Normal sinus rhythm Minimal voltage criteria for LVH, may be normal variant ( R in aVL ) Anterior infarct , age undetermined68  CV:  Stress test 11/27/2019:  Nuclear stress EF: 63%. There was no ST segment deviation noted during stress. The study is normal. This is a low risk study. The left ventricular ejection fraction is normal (55-65%).   CT calcium  score 11/26/2019:  IMPRESSION: Coronary calcium  score of 1. This was 54th percentile for age and sex matched control.   Past Medical History:  Diagnosis Date   Anemia    Anxiety    Arthritis    Calculus of gallbladder with chronic cholecystitis without obstruction 05/21/2019   Chronic lymphatic leukemia (HCC)    Diabetes mellitus without complication (HCC)    Elevated cholesterol    Encounter for screening colonoscopy 05/21/2019   Environmental allergies    GERD (gastroesophageal  reflux disease)    Hashimoto's disease    History of kidney stones    Hyperlipidemia    Hypertension    Hypothyroidism    Obesity    RUQ pain 05/21/2019   Symptomatic menopausal or female climacteric states    Varicose veins of bilateral lower extremities with other complications    Venous insufficiency of both lower extremities     Past Surgical History:  Procedure Laterality Date   CHOLECYSTECTOMY  06/2019   DILATION AND CURETTAGE OF UTERUS     VEIN LIGATION AND STRIPPING  1989    MEDICATIONS:   amLODipine -benazepril  (LOTREL) 5-10 MG capsule   DHEA 25 MG CAPS   ergocalciferol (VITAMIN D2) 1.25 MG (50000 UT) capsule   escitalopram  (LEXAPRO ) 10 MG tablet   estradiol  (ESTRACE ) 1 MG tablet   fluticasone (FLONASE) 50 MCG/ACT nasal spray   hydrochlorothiazide (MICROZIDE) 12.5 MG capsule   ketotifen (ZADITOR) 0.035 % ophthalmic solution   levothyroxine  (SYNTHROID ) 125 MCG tablet   metFORMIN (GLUCOPHAGE-XR) 500 MG 24 hr tablet   nitroGLYCERIN  (NITROSTAT ) 0.4 MG SL tablet   NON FORMULARY   progesterone  (PROMETRIUM ) 200 MG capsule   RABEprazole (ACIPHEX) 20 MG tablet   rosuvastatin  (CRESTOR ) 10 MG tablet   rosuvastatin  (CRESTOR ) 5 MG tablet   sodium chloride  (OCEAN) 0.65 % SOLN nasal spray   No current facility-administered medications for this encounter.   Burnard CHRISTELLA Odis DEVONNA [facility] Short Stay/Anesthesiology [facility] Phone [phone] 08/23/2023 9:26 AM

## 2023-08-23 NOTE — H&P (Signed)
 Office Visit Report     08/09/2023   --------------------------------------------------------------------------------   Michele King  MRN: [id number]  DOB: 11-Jan-1956, 67 year old Female  SSN:    PRIMARY CARE:  Aleck C. Prochnau, MD  PRIMARY CARE FAX:  [phone]  REFERRING:  Lonni A. Devere, MD  PROVIDER:  Lonni Devere, M.D.  TREATING:  [employer]  LOCATION:  [facility] [phone]     --------------------------------------------------------------------------------   CC/HPI: Left renal mass   Michele King is a 58 old female who was found to have a solid and enhancing 3.3 cm left renal mass with features concerning for renal cell carcinoma. The lesion was initially identified on CT abdomen/pelvis with and without contrast on 06/23/2023 during evaluation for generalized abdominal pain.   -Hx of chronic anemia and CLL that was diagnosed in 2015--has not required treatment  -Anatomy: Mesophytic, anterior left mid-pole mass abutting the sinus fat/collecting system--single artery and vein. Right kidney is WNL--6 mm, non-obstructing lower pole stone  -Personal/family history of GU malignancies:  -Smoking history: non-smoker  -Prior abdominal surgeries: lap cholecystectomy in 2022  -Renal function: Serum creatinine was 0.65 in May 2025  -History of kidney stones: As above   08/09/2023:  Michele King is here today for preop evaluation prior to left robotic partial/possible radical nephrectomy due to a suspicious mass concerning for malignancy found on a CT that was performed during workup for generalized abdominal pain in May of this year. The patient denies any new shortness of breath or chest pain. She denies any new medications or medical conditions since her appointment with Dr. Devere a few weeks ago. She has her preoperative appointment with anesthesia on 21 July. Surgery scheduled for the 23rd. The patient is not on any blood thinners. The only diabetes  medication she takes is metformin. Urinalysis is reassuring and WNL today.     ALLERGIES: Sulfa Products (All)    MEDICATIONS: Crestor   Levothyroxine  Sodium 125 MCG Capsule  metFORMIN HCl 500 MG Tablet  amLODIPine  Besylate  DHEA  Estrogel   Hydrpcloret  Lexapro  10 MG Tablet  Progesterone   RABEprazole Sodium     GU PSH: No GU PSH      PSH Notes: vein stripping left leg   NON-GU PSH: Cholecystectomy (laparoscopic)     GU PMH: Left renal neoplasm - 07/06/2023      PMH Notes: Cardiovascular and Mediastinum  Varicose veins of bilateral lower extremities with other complications  Venous insufficiency of both lower extremities   Digestive  Calculus of gallbladder with chronic cholecystitis without obstruction   Endocrine  Thyroid  disease  Hashimoto's disease  Diabetes mellitus due to underlying condition with unspecified complications (HCC)   Other   Chronic lymphatic leukemia (HCC)  Hyperlipidemia  Chest discomfort  BMI 34.0-34.9,adult  Encounter for screening colonoscopy  Postoperative examination  RUQ pain  Special screening examination for viral disease  Obesity  Symptomatic menopausal or female climacteric states  Environmental allergies  Elevated cholesterol  Obesity (BMI 30.0-34.9)  History of colonic polyps  Iron  deficiency anemia     NON-GU PMH: Anxiety Arthritis Diabetes Type 2 GERD Hypercholesterolemia Hypertension Sleep Apnea    FAMILY HISTORY: 1 Daughter - Daughter 1 son - Son   SOCIAL HISTORY: Marital Status: Divorced Preferred Language: English; Race: White Current Smoking Status: Patient has never smoked.   Tobacco Use Assessment Completed: Used Tobacco in last 30 days? Has never drank.  Drinks 2 caffeinated drinks per day.    REVIEW OF SYSTEMS:  GU Review Female:   Patient denies frequent urination, hard to postpone urination, burning /pain with urination, get up at night to urinate, leakage of urine, stream starts and stops,  trouble starting your stream, have to strain to urinate, and being pregnant.  Gastrointestinal (Upper):   Patient denies nausea, vomiting, and indigestion/ heartburn.  Gastrointestinal (Lower):   Patient denies diarrhea and constipation.  Constitutional:   Patient denies fever, night sweats, weight loss, and fatigue.  Skin:   Patient denies skin rash/ lesion and itching.  Eyes:   Patient denies blurred vision and double vision.  Ears/ Nose/ Throat:   Patient denies sore throat and sinus problems.  Hematologic/Lymphatic:   Patient denies swollen glands and easy bruising.  Cardiovascular:   Patient denies leg swelling and chest pains.  Respiratory:   Patient denies cough and shortness of breath.  Endocrine:   Patient denies excessive thirst.  Musculoskeletal:   Patient denies back pain and joint pain.  Neurological:   Patient denies headaches and dizziness.  Psychologic:   Patient denies depression and anxiety.   VITAL SIGNS:      08/09/2023 12:46 PM  BP 117/65 mmHg  Pulse 67 /min  Temperature 97.1 F / 36.1 C   GU PHYSICAL EXAMINATION:    Breast: Symmetrical. No tenderness, no nipple discharge, no skin changes. No mass.  Digital Rectal Exam: Normal sphincter tone. No rectal mass.  External Genitalia: No hirsutism, no rash, no scarring, no cyst, no erythematous lesion, no papular lesion, no blanched lesion, no warty lesion. No edema.  Urethral Meatus: Normal size. Normal position. No discharge.  Urethra: No tenderness, no mass, no scarring. No hypermobility. No leakage.  Bladder: Normal to palpation, no tenderness, no mass, normal size.  Vagina: No atrophy, no stenosis. No rectocele. No cystocele. No enterocele.  Cervix: No inflammation, no discharge, no lesion, no tenderness, no wart.  Uterus: Normal size. Normal consistency. Normal position. No mobility. No descent.  Adnexa / Parametria: No tenderness. No adnexal mass. Normal left ovary. Normal right ovary.  Anus and Perineum: No  hemorrhoids. No anal stenosis. No rectal fissure, no anal fissure. No edema, no dimple, no perineal tenderness, no anal tenderness.   MULTI-SYSTEM PHYSICAL EXAMINATION:    Constitutional: Well-nourished. No physical deformities. Normally developed. Good grooming.  Neck: Neck symmetrical, not swollen. Normal tracheal position.  Respiratory: No labored breathing, no use of accessory muscles.   Cardiovascular: Normal temperature, normal extremity pulses, no swelling, no varicosities.  Lymphatic: No enlargement of neck, axillae, groin.  Skin: No paleness, no jaundice, no cyanosis. No lesion, no ulcer, no rash.  Neurologic / Psychiatric: Oriented to time, oriented to place, oriented to person. No depression, no anxiety, no agitation.  Gastrointestinal: No mass, no tenderness, no rigidity, non obese abdomen.  Eyes: Normal conjunctivae. Normal eyelids.  Ears, Nose, Mouth, and Throat: Left ear no scars, no lesions, no masses. Right ear no scars, no lesions, no masses. Nose no scars, no lesions, no masses. Normal hearing. Normal lips.  Musculoskeletal: Normal gait and station of head and neck.     Complexity of Data:  Source Of History:  Patient, Medical Record Summary  Records Review:   Previous Doctor Records, Previous Hospital Records, Previous Patient Records  Urine Test Review:   Urinalysis  X-Ray Review: C.T. Abdomen/Pelvis: Reviewed Report. Discussed With Patient.    Notes:                     I need to ask Alan is  Alan here today   PROCEDURES:          Visit Complexity - G2211          Urinalysis - 81003 Dipstick Dipstick Cont'd  Color: Straw Bilirubin: Neg mg/dL  Appearance: Clear Ketones: Neg mg/dL  Specific Gravity: 8.989 Blood: Neg ery/uL  pH: 6.5 Protein: Neg mg/dL  Glucose: Neg mg/dL Urobilinogen: 0.2 mg/dL    Nitrites: Neg    Leukocyte Esterase: Neg leu/uL    ASSESSMENT:      ICD-10 Details  1 GU:   Left renal neoplasm - D49.512 Chronic, Threat to Bodily Function    PLAN:           Orders Labs Urine Culture          Schedule Return Visit/Planned Activity: Keep Scheduled Appointment          Document Letter(s):  Created for Patient: Clinical Summary         Notes:   The patient looks well today. We talked briefly about her hospital stay and postoperative expectations including incision care, activity restrictions, and follow-up appointments. The patient's 2 sisters are coming to stay with her during and after her surgery to help take care of her. I will send urine for culture out of an abundance of caution with her upcoming surgery. She will proceed for her anesthesia evaluation on the 21st and then surgery on the 23rd. Her initial postoperative appointment is already scheduled with Dr. Devere on August 4.    -I personally reviewed imaging results and films with the patient. We discussed that the mass in question has features concerning for malignancy. I explained the natural history of presumed renal cell carcinoma. I reviewed the AUA guidelines for evaluation and treatment of the small renal mass. The options of active surveillance, in situ tumor ablation, partial and radical nephrectomy was discussed. The risks of robot-assisted LEFT partial nephrectomy were discussed in detail including but not limited to: negative pathology, open conversion, completion nephrectomy, infection of the urinary tract/skin/abdominal cavity, VTE, MI/CVA, lymphatic leak, injury to adjacent solid/hollow viscus organs, bleeding requiring a blood transfusion, catastrophic bleeding, hernia formation, need for postoperative angioembolization, urinary leak requiring stent/drain, and other imponderables.

## 2023-08-23 NOTE — Anesthesia Preprocedure Evaluation (Signed)
 Anesthesia Evaluation    Airway        Dental   Pulmonary           Cardiovascular hypertension,      Neuro/Psych    GI/Hepatic   Endo/Other  diabetes    Renal/GU      Musculoskeletal   Abdominal   Peds  Hematology   Anesthesia Other Findings   Reproductive/Obstetrics                              Anesthesia Physical Anesthesia Plan  ASA:   Anesthesia Plan:    Post-op Pain Management:    Induction:   PONV Risk Score and Plan:   Airway Management Planned:   Additional Equipment:   Intra-op Plan:   Post-operative Plan:   Informed Consent:   Plan Discussed with:   Anesthesia Plan Comments: (See PAT note from 7/21)         Anesthesia Quick Evaluation

## 2023-08-24 ENCOUNTER — Other Ambulatory Visit: Payer: Self-pay

## 2023-08-24 ENCOUNTER — Encounter (HOSPITAL_COMMUNITY)
Admission: RE | Disposition: A | Discharge status: Home / Self Care | Payer: Self-pay | Source: Home / Self Care | Attending: Urology

## 2023-08-24 ENCOUNTER — Ambulatory Visit (HOSPITAL_COMMUNITY): Payer: Self-pay | Admitting: Medical

## 2023-08-24 ENCOUNTER — Ambulatory Visit (HOSPITAL_COMMUNITY): Payer: Self-pay

## 2023-08-24 ENCOUNTER — Encounter (HOSPITAL_COMMUNITY): Payer: Self-pay | Admitting: Urology

## 2023-08-24 ENCOUNTER — Observation Stay (HOSPITAL_COMMUNITY)
Admission: RE | Admit: 2023-08-24 | Discharge: 2023-08-26 | Disposition: A | Discharge status: Home / Self Care | Attending: Urology | Admitting: Urology

## 2023-08-24 DIAGNOSIS — E088 Diabetes mellitus due to underlying condition with unspecified complications: Secondary | ICD-10-CM | POA: Insufficient documentation

## 2023-08-24 DIAGNOSIS — N2889 Other specified disorders of kidney and ureter: Secondary | ICD-10-CM | POA: Diagnosis not present

## 2023-08-24 DIAGNOSIS — Z7984 Long term (current) use of oral hypoglycemic drugs: Secondary | ICD-10-CM | POA: Diagnosis not present

## 2023-08-24 DIAGNOSIS — E039 Hypothyroidism, unspecified: Secondary | ICD-10-CM | POA: Diagnosis not present

## 2023-08-24 DIAGNOSIS — C642 Malignant neoplasm of left kidney, except renal pelvis: Secondary | ICD-10-CM | POA: Diagnosis not present

## 2023-08-24 DIAGNOSIS — F17220 Nicotine dependence, chewing tobacco, uncomplicated: Secondary | ICD-10-CM | POA: Diagnosis not present

## 2023-08-24 DIAGNOSIS — E119 Type 2 diabetes mellitus without complications: Secondary | ICD-10-CM | POA: Diagnosis not present

## 2023-08-24 DIAGNOSIS — Z79899 Other long term (current) drug therapy: Secondary | ICD-10-CM | POA: Insufficient documentation

## 2023-08-24 DIAGNOSIS — D49512 Neoplasm of unspecified behavior of left kidney: Secondary | ICD-10-CM | POA: Diagnosis not present

## 2023-08-24 DIAGNOSIS — I10 Essential (primary) hypertension: Secondary | ICD-10-CM | POA: Insufficient documentation

## 2023-08-24 LAB — GLUCOSE, CAPILLARY
Glucose-Capillary: 110 mg/dL — ABNORMAL HIGH (ref 70–99)
Glucose-Capillary: 165 mg/dL — ABNORMAL HIGH (ref 70–99)
Glucose-Capillary: 183 mg/dL — ABNORMAL HIGH (ref 70–99)

## 2023-08-24 LAB — HEMOGLOBIN AND HEMATOCRIT, BLOOD
HCT: 43 % (ref 36.0–46.0)
Hemoglobin: 12.7 g/dL (ref 12.0–15.0)

## 2023-08-24 LAB — ABO/RH: ABO/RH(D): O POS

## 2023-08-24 LAB — TYPE AND SCREEN
ABO/RH(D): O POS
Antibody Screen: NEGATIVE

## 2023-08-24 SURGERY — NEPHRECTOMY, PARTIAL, ROBOT-ASSISTED
Anesthesia: General | Laterality: Left

## 2023-08-24 MED ORDER — ONDANSETRON HCL 4 MG/2ML IJ SOLN
4.0000 mg | INTRAMUSCULAR | Status: DC | PRN
Start: 2023-08-24 — End: 2023-08-26
  Administered 2023-08-25: 4 mg via INTRAVENOUS
  Filled 2023-08-24: qty 2

## 2023-08-24 MED ORDER — STERILE WATER FOR IRRIGATION IR SOLN
Status: DC | PRN
Start: 2023-08-24 — End: 2023-08-24
  Administered 2023-08-24: 1000 mL

## 2023-08-24 MED ORDER — AMLODIPINE BESYLATE 10 MG PO TABS
5.0000 mg | ORAL_TABLET | Freq: Every day | ORAL | Status: DC
  Administered 2023-08-24 – 2023-08-26 (×3): 5 mg via ORAL
  Filled 2023-08-24 (×3): qty 1

## 2023-08-24 MED ORDER — HYDROCODONE-ACETAMINOPHEN 5-325 MG PO TABS: 1.0000 | ORAL_TABLET | Freq: Four times a day (QID) | ORAL | 0 refills | Status: AC | PRN

## 2023-08-24 MED ORDER — ROSUVASTATIN CALCIUM 5 MG PO TABS
5.0000 mg | ORAL_TABLET | Freq: Every day | ORAL | Status: DC
  Administered 2023-08-24 – 2023-08-26 (×3): 5 mg via ORAL
  Filled 2023-08-24 (×3): qty 1

## 2023-08-24 MED ORDER — KETAMINE HCL 50 MG/5ML IJ SOSY
PREFILLED_SYRINGE | INTRAMUSCULAR | Status: AC
  Filled 2023-08-24: qty 5

## 2023-08-24 MED ORDER — ROCURONIUM BROMIDE 10 MG/ML (PF) SYRINGE
PREFILLED_SYRINGE | INTRAVENOUS | Status: AC
Start: 2023-08-24 — End: 2023-08-24
  Filled 2023-08-24: qty 10

## 2023-08-24 MED ORDER — LACTATED RINGERS IV SOLN: INTRAVENOUS | Status: DC

## 2023-08-24 MED ORDER — HYOSCYAMINE SULFATE 0.125 MG SL SUBL: 0.1250 mg | SUBLINGUAL_TABLET | SUBLINGUAL | Status: DC | PRN

## 2023-08-24 MED ORDER — OXYCODONE HCL 5 MG PO TABS
5.0000 mg | ORAL_TABLET | ORAL | Status: DC | PRN
  Administered 2023-08-25 – 2023-08-26 (×2): 5 mg via ORAL
  Filled 2023-08-24 (×2): qty 1

## 2023-08-24 MED ORDER — DIPHENHYDRAMINE HCL 50 MG/ML IJ SOLN: 12.5000 mg | Freq: Four times a day (QID) | INTRAMUSCULAR | Status: DC | PRN

## 2023-08-24 MED ORDER — ONDANSETRON HCL 4 MG/2ML IJ SOLN
INTRAMUSCULAR | Status: DC | PRN
  Administered 2023-08-24: 4 mg via INTRAVENOUS

## 2023-08-24 MED ORDER — EPHEDRINE 5 MG/ML INJ
INTRAVENOUS | Status: AC
  Filled 2023-08-24: qty 5

## 2023-08-24 MED ORDER — PHENYLEPHRINE 80 MCG/ML (10ML) SYRINGE FOR IV PUSH (FOR BLOOD PRESSURE SUPPORT)
PREFILLED_SYRINGE | INTRAVENOUS | Status: AC
  Filled 2023-08-24: qty 10

## 2023-08-24 MED ORDER — ONDANSETRON HCL 4 MG/2ML IJ SOLN
INTRAMUSCULAR | Status: AC
  Filled 2023-08-24: qty 2

## 2023-08-24 MED ORDER — NAPHAZOLINE-PHENIRAMINE 0.025-0.3 % OP SOLN: 2.0000 [drp] | Freq: Four times a day (QID) | OPHTHALMIC | Status: DC | PRN

## 2023-08-24 MED ORDER — HYDROMORPHONE HCL 1 MG/ML IJ SOLN: 0.5000 mg | INTRAMUSCULAR | Status: DC | PRN

## 2023-08-24 MED ORDER — ACETAMINOPHEN 500 MG PO TABS
1000.0000 mg | ORAL_TABLET | Freq: Once | ORAL | Status: AC
  Administered 2023-08-24: 1000 mg via ORAL
  Filled 2023-08-24: qty 2

## 2023-08-24 MED ORDER — SUGAMMADEX SODIUM 200 MG/2ML IV SOLN
INTRAVENOUS | Status: DC | PRN
  Administered 2023-08-24: 200 mg via INTRAVENOUS

## 2023-08-24 MED ORDER — ESCITALOPRAM OXALATE 10 MG PO TABS
10.0000 mg | ORAL_TABLET | Freq: Every day | ORAL | Status: DC
  Administered 2023-08-24 – 2023-08-26 (×3): 10 mg via ORAL
  Filled 2023-08-24 (×3): qty 1

## 2023-08-24 MED ORDER — DEXAMETHASONE SODIUM PHOSPHATE 10 MG/ML IJ SOLN
INTRAMUSCULAR | Status: DC | PRN
  Administered 2023-08-24: 5 mg via INTRAVENOUS

## 2023-08-24 MED ORDER — SODIUM CHLORIDE 0.45 % IV SOLN: INTRAVENOUS | Status: DC

## 2023-08-24 MED ORDER — MEPERIDINE HCL 25 MG/ML IJ SOLN: 6.2500 mg | INTRAMUSCULAR | Status: DC | PRN

## 2023-08-24 MED ORDER — DIPHENHYDRAMINE HCL 12.5 MG/5ML PO ELIX: 12.5000 mg | ORAL_SOLUTION | Freq: Four times a day (QID) | ORAL | Status: DC | PRN

## 2023-08-24 MED ORDER — FENTANYL CITRATE (PF) 100 MCG/2ML IJ SOLN
INTRAMUSCULAR | Status: AC
Start: 2023-08-24 — End: 2023-08-24
  Filled 2023-08-24: qty 2

## 2023-08-24 MED ORDER — FENTANYL CITRATE (PF) 100 MCG/2ML IJ SOLN
INTRAMUSCULAR | Status: DC | PRN
  Administered 2023-08-24 (×2): 50 ug via INTRAVENOUS

## 2023-08-24 MED ORDER — OXYCODONE HCL 5 MG PO TABS: 5.0000 mg | ORAL_TABLET | Freq: Once | ORAL | Status: DC | PRN

## 2023-08-24 MED ORDER — FENTANYL CITRATE PF 50 MCG/ML IJ SOSY
PREFILLED_SYRINGE | INTRAMUSCULAR | Status: AC
Start: 2023-08-24 — End: 2023-08-24
  Filled 2023-08-24: qty 1

## 2023-08-24 MED ORDER — ORAL CARE MOUTH RINSE: 15.0000 mL | Freq: Once | OROMUCOSAL | Status: AC

## 2023-08-24 MED ORDER — PROPOFOL 10 MG/ML IV BOLUS
INTRAVENOUS | Status: DC | PRN
Start: 2023-08-24 — End: 2023-08-24
  Administered 2023-08-24: 150 mg via INTRAVENOUS
  Administered 2023-08-24: 50 ug/kg/min via INTRAVENOUS

## 2023-08-24 MED ORDER — BUPIVACAINE LIPOSOME 1.3 % IJ SUSP
INTRAMUSCULAR | Status: DC | PRN
  Administered 2023-08-24: 20 mL

## 2023-08-24 MED ORDER — OXYCODONE HCL 5 MG/5ML PO SOLN: 5.0000 mg | Freq: Once | ORAL | Status: DC | PRN

## 2023-08-24 MED ORDER — INSULIN ASPART 100 UNIT/ML IJ SOLN: 0.0000 [IU] | INTRAMUSCULAR | Status: DC | PRN

## 2023-08-24 MED ORDER — PANTOPRAZOLE SODIUM 40 MG PO TBEC
40.0000 mg | DELAYED_RELEASE_TABLET | Freq: Every day | ORAL | Status: DC
  Administered 2023-08-24 – 2023-08-26 (×3): 40 mg via ORAL
  Filled 2023-08-24 (×3): qty 1

## 2023-08-24 MED ORDER — DEXAMETHASONE SODIUM PHOSPHATE 10 MG/ML IJ SOLN
INTRAMUSCULAR | Status: AC
  Filled 2023-08-24: qty 1

## 2023-08-24 MED ORDER — MIDAZOLAM HCL 5 MG/5ML IJ SOLN
INTRAMUSCULAR | Status: DC | PRN
Start: 2023-08-24 — End: 2023-08-24
  Administered 2023-08-24: 2 mg via INTRAVENOUS

## 2023-08-24 MED ORDER — HYDROMORPHONE HCL 2 MG/ML IJ SOLN
INTRAMUSCULAR | Status: AC
Start: 2023-08-24 — End: 2023-08-24
  Filled 2023-08-24: qty 1

## 2023-08-24 MED ORDER — CEFAZOLIN SODIUM-DEXTROSE 2-4 GM/100ML-% IV SOLN
2.0000 g | INTRAVENOUS | Status: AC
  Administered 2023-08-24: 2 g via INTRAVENOUS
  Filled 2023-08-24: qty 100

## 2023-08-24 MED ORDER — SODIUM CHLORIDE (PF) 0.9 % IJ SOLN
INTRAMUSCULAR | Status: AC
  Filled 2023-08-24: qty 20

## 2023-08-24 MED ORDER — KETAMINE HCL 50 MG/5ML IJ SOSY
PREFILLED_SYRINGE | INTRAMUSCULAR | Status: DC | PRN
  Administered 2023-08-24: 20 mg via INTRAVENOUS

## 2023-08-24 MED ORDER — ACETAMINOPHEN 500 MG PO TABS
1000.0000 mg | ORAL_TABLET | Freq: Four times a day (QID) | ORAL | Status: AC
  Administered 2023-08-24 – 2023-08-25 (×4): 1000 mg via ORAL
  Filled 2023-08-24 (×4): qty 2

## 2023-08-24 MED ORDER — ONDANSETRON HCL 4 MG/2ML IJ SOLN: 4.0000 mg | Freq: Once | INTRAMUSCULAR | Status: DC | PRN

## 2023-08-24 MED ORDER — CHLORHEXIDINE GLUCONATE 0.12 % MT SOLN
15.0000 mL | Freq: Once | OROMUCOSAL | Status: AC
  Administered 2023-08-24: 15 mL via OROMUCOSAL

## 2023-08-24 MED ORDER — DOCUSATE SODIUM 100 MG PO CAPS: 100.0000 mg | ORAL_CAPSULE | Freq: Two times a day (BID) | ORAL | Status: AC

## 2023-08-24 MED ORDER — LIDOCAINE HCL (PF) 2 % IJ SOLN
INTRAMUSCULAR | Status: AC
  Filled 2023-08-24: qty 5

## 2023-08-24 MED ORDER — LEVOTHYROXINE SODIUM 100 MCG PO TABS
125.0000 ug | ORAL_TABLET | Freq: Every day | ORAL | Status: DC
  Administered 2023-08-25 – 2023-08-26 (×2): 125 ug via ORAL
  Filled 2023-08-24 (×2): qty 1

## 2023-08-24 MED ORDER — PROGESTERONE MICRONIZED 100 MG PO CAPS
200.0000 mg | ORAL_CAPSULE | Freq: Every day | ORAL | Status: DC
  Administered 2023-08-24 – 2023-08-25 (×2): 200 mg via ORAL
  Filled 2023-08-24 (×2): qty 2

## 2023-08-24 MED ORDER — BUPIVACAINE LIPOSOME 1.3 % IJ SUSP
INTRAMUSCULAR | Status: AC
  Filled 2023-08-24: qty 20

## 2023-08-24 MED ORDER — ROCURONIUM BROMIDE 100 MG/10ML IV SOLN
INTRAVENOUS | Status: DC | PRN
  Administered 2023-08-24: 10 mg via INTRAVENOUS
  Administered 2023-08-24: 60 mg via INTRAVENOUS

## 2023-08-24 MED ORDER — PROPOFOL 10 MG/ML IV BOLUS
INTRAVENOUS | Status: AC
Start: 2023-08-24 — End: 2023-08-24
  Filled 2023-08-24: qty 20

## 2023-08-24 MED ORDER — DOCUSATE SODIUM 100 MG PO CAPS
100.0000 mg | ORAL_CAPSULE | Freq: Two times a day (BID) | ORAL | Status: DC
  Administered 2023-08-24 – 2023-08-26 (×4): 100 mg via ORAL
  Filled 2023-08-24 (×4): qty 1

## 2023-08-24 MED ORDER — CELECOXIB 200 MG PO CAPS
200.0000 mg | ORAL_CAPSULE | Freq: Once | ORAL | Status: AC
  Administered 2023-08-24: 200 mg via ORAL
  Filled 2023-08-24: qty 1

## 2023-08-24 MED ORDER — FENTANYL CITRATE PF 50 MCG/ML IJ SOSY
25.0000 ug | PREFILLED_SYRINGE | INTRAMUSCULAR | Status: DC | PRN
  Administered 2023-08-24 (×2): 25 ug via INTRAVENOUS

## 2023-08-24 MED ORDER — BENAZEPRIL HCL 20 MG PO TABS
10.0000 mg | ORAL_TABLET | Freq: Every day | ORAL | Status: DC
  Administered 2023-08-24 – 2023-08-26 (×3): 10 mg via ORAL
  Filled 2023-08-24 (×3): qty 1

## 2023-08-24 MED ORDER — LIDOCAINE HCL (CARDIAC) PF 100 MG/5ML IV SOSY
PREFILLED_SYRINGE | INTRAVENOUS | Status: DC | PRN
  Administered 2023-08-24: 80 mg via INTRAVENOUS

## 2023-08-24 MED ORDER — ESTRADIOL 0.5 MG PO TABS
1.0000 mg | ORAL_TABLET | Freq: Every day | ORAL | Status: DC
Start: 2023-08-24 — End: 2023-08-26
  Administered 2023-08-24 – 2023-08-26 (×3): 1 mg via ORAL
  Filled 2023-08-24 (×3): qty 2

## 2023-08-24 MED ORDER — INSULIN ASPART 100 UNIT/ML IJ SOLN
0.0000 [IU] | Freq: Three times a day (TID) | INTRAMUSCULAR | Status: DC
  Administered 2023-08-25: 2 [IU] via SUBCUTANEOUS

## 2023-08-24 MED ORDER — SODIUM CHLORIDE (PF) 0.9 % IJ SOLN
INTRAMUSCULAR | Status: DC | PRN
  Administered 2023-08-24: 20 mL

## 2023-08-24 MED ORDER — MIDAZOLAM HCL 2 MG/2ML IJ SOLN
INTRAMUSCULAR | Status: AC
  Filled 2023-08-24: qty 2

## 2023-08-24 MED ORDER — VISTASEAL 4 ML SINGLE DOSE KIT
4.0000 mL | PACK | Freq: Once | CUTANEOUS | Status: AC
  Administered 2023-08-24: 4 mL via TOPICAL
  Filled 2023-08-24: qty 4

## 2023-08-24 SURGICAL SUPPLY — 66 items
APPLICATOR SURGIFLO ENDO (HEMOSTASIS) IMPLANT
APPLICATOR VISTASEAL 35 (MISCELLANEOUS) ×1 IMPLANT
BAG COUNTER SPONGE SURGICOUNT (BAG) IMPLANT
CAUTERY HOOK MNPLR 1.6 DVNC XI (INSTRUMENTS) ×1 IMPLANT
CHLORAPREP W/TINT 26 (MISCELLANEOUS) ×1 IMPLANT
CLIP LIGATING HEM O LOK PURPLE (MISCELLANEOUS) ×2 IMPLANT
CLIP LIGATING HEMO LOK XL GOLD (MISCELLANEOUS) IMPLANT
CLIP LIGATING HEMO O LOK GREEN (MISCELLANEOUS) IMPLANT
CLIP SUT LAPRA TY ABSORB (SUTURE) ×2 IMPLANT
COVER SURGICAL LIGHT HANDLE (MISCELLANEOUS) ×1 IMPLANT
COVER TIP SHEARS 8 DVNC (MISCELLANEOUS) ×1 IMPLANT
DERMABOND ADVANCED .7 DNX12 (GAUZE/BANDAGES/DRESSINGS) ×1 IMPLANT
DRAIN CHANNEL 15F RND FF 3/16 (WOUND CARE) IMPLANT
DRAPE ARM DVNC X/XI (DISPOSABLE) ×4 IMPLANT
DRAPE COLUMN DVNC XI (DISPOSABLE) ×1 IMPLANT
DRAPE INCISE IOBAN 66X45 STRL (DRAPES) ×1 IMPLANT
DRAPE SHEET LG 3/4 BI-LAMINATE (DRAPES) ×1 IMPLANT
DRIVER NDL LRG 8 DVNC XI (INSTRUMENTS) ×3 IMPLANT
DRIVER NDLE LRG 8 DVNC XI (INSTRUMENTS) ×2 IMPLANT
ELECT PENCIL ROCKER SW 15FT (MISCELLANEOUS) ×1 IMPLANT
ELECT REM PT RETURN 15FT ADLT (MISCELLANEOUS) ×1 IMPLANT
EVACUATOR SILICONE 100CC (DRAIN) IMPLANT
FORCEPS PROGRASP DVNC XI (FORCEP) ×2 IMPLANT
GAUZE 4X4 16PLY ~~LOC~~+RFID DBL (SPONGE) IMPLANT
GLOVE BIO SURGEON STRL SZ 6.5 (GLOVE) ×1 IMPLANT
GLOVE BIOGEL PI IND STRL 8 (GLOVE) ×1 IMPLANT
GLOVE SURG LX STRL 8.0 MICRO (GLOVE) ×2 IMPLANT
GOWN STRL REUS W/ TWL XL LVL3 (GOWN DISPOSABLE) ×2 IMPLANT
GOWN STRL SURGICAL XL XLNG (GOWN DISPOSABLE) ×1 IMPLANT
HEMOSTAT SURGICEL 4X8 (HEMOSTASIS) ×1 IMPLANT
HOLDER FOLEY CATH W/STRAP (MISCELLANEOUS) ×1 IMPLANT
IRRIGATION SUCT STRKRFLW 2 WTP (MISCELLANEOUS) ×1 IMPLANT
KIT BASIN OR (CUSTOM PROCEDURE TRAY) ×1 IMPLANT
KIT TURNOVER KIT A (KITS) ×1 IMPLANT
LOOP VESSEL MAXI BLUE (MISCELLANEOUS) IMPLANT
MARKER SKIN DUAL TIP RULER LAB (MISCELLANEOUS) ×1 IMPLANT
NDL INSUFFLATION 14GA 120MM (NEEDLE) ×1 IMPLANT
NEEDLE INSUFFLATION 14GA 120MM (NEEDLE) ×1 IMPLANT
PAD POSITIONING PINK XL (MISCELLANEOUS) ×1 IMPLANT
PROTECTOR NERVE ULNAR (MISCELLANEOUS) ×2 IMPLANT
RELOAD STAPLE 45 2.6 WHT THIN (STAPLE) IMPLANT
SCISSORS LAP 5X45 EPIX DISP (ENDOMECHANICALS) ×1 IMPLANT
SCISSORS MNPLR CVD DVNC XI (INSTRUMENTS) ×1 IMPLANT
SEAL UNIV 5-12 XI (MISCELLANEOUS) ×4 IMPLANT
SET TUBE SMOKE EVAC HIGH FLOW (TUBING) ×1 IMPLANT
SOLUTION ELECTROSURG ANTI STCK (MISCELLANEOUS) ×1 IMPLANT
SPIKE FLUID TRANSFER (MISCELLANEOUS) ×1 IMPLANT
STAPLER POWER ECHELON 45 WIDE (STAPLE) IMPLANT
SURGIFLO W/THROMBIN 8M KIT (HEMOSTASIS) IMPLANT
SUT ETHILON 2 0 PS N (SUTURE) IMPLANT
SUT MNCRL AB 4-0 PS2 18 (SUTURE) ×2 IMPLANT
SUT PDS AB 0 CT1 36 (SUTURE) IMPLANT
SUT STRATAFIX SPIRAL PDS3-0 (SUTURE) ×1 IMPLANT
SUT VIC AB 1 CT1 36 (SUTURE) ×4 IMPLANT
SUT VIC AB 2-0 SH 27X BRD (SUTURE) IMPLANT
SUT VICRYL 0 UR6 27IN ABS (SUTURE) IMPLANT
SUT VLOC 3-0 9IN GRN (SUTURE) IMPLANT
SUTURE V-LC BRB 180 2/0GR6GS22 (SUTURE) IMPLANT
SUTURE VLOC BRB 180 ABS3/0GR12 (SUTURE) ×1 IMPLANT
SYSTEM BAG RETRIEVAL 10MM (BASKET) ×1 IMPLANT
TOWEL OR 17X26 10 PK STRL BLUE (TOWEL DISPOSABLE) ×1 IMPLANT
TRAY FOLEY MTR SLVR 16FR STAT (SET/KITS/TRAYS/PACK) ×1 IMPLANT
TRAY LAPAROSCOPIC (CUSTOM PROCEDURE TRAY) ×1 IMPLANT
TROCAR Z THREAD OPTICAL 12X100 (TROCAR) ×1 IMPLANT
TROCAR Z-THREAD OPTICAL 5X100M (TROCAR) IMPLANT
WATER STERILE IRR 1000ML POUR (IV SOLUTION) ×1 IMPLANT

## 2023-08-24 NOTE — Anesthesia Postprocedure Evaluation (Signed)
 Anesthesia Post Note  Patient: Michele King  Procedure(s) Performed: NEPHRECTOMY, PARTIAL, ROBOT-ASSISTED (Left)     Patient location during evaluation: PACU Anesthesia Type: General Level of consciousness: awake and alert Pain management: pain level controlled Vital Signs Assessment: post-procedure vital signs reviewed and stable Respiratory status: spontaneous breathing, nonlabored ventilation, respiratory function stable and patient connected to nasal cannula oxygen Cardiovascular status: blood pressure returned to baseline and stable Postop Assessment: no apparent nausea or vomiting Anesthetic complications: no   No notable events documented.  Last Vitals:  Vitals:   08/24/23 1502 08/24/23 1515  BP: 127/63   Pulse: 70 69  Resp: 19 17  Temp:    SpO2: (!) 87% 90%    Last Pain:  Vitals:   08/24/23 1515  TempSrc:   PainSc: Asleep                 Azazel Franze

## 2023-08-24 NOTE — Transfer of Care (Signed)
 Immediate Anesthesia Transfer of Care Note  Patient: Michele King  Procedure(s) Performed: NEPHRECTOMY, PARTIAL, ROBOT-ASSISTED (Left)  Patient Location: PACU  Anesthesia Type:General  Level of Consciousness: drowsy  Airway & Oxygen Therapy: Patient Spontanous Breathing and Patient connected to face mask oxygen  Post-op Assessment: Report given to RN  Post vital signs: Reviewed and stable  Last Vitals:  Vitals Value Taken Time  BP 111/67 08/24/23 14:15  Temp    Pulse 74 08/24/23 14:20  Resp 24 08/24/23 14:20  SpO2 97 % 08/24/23 14:20  Vitals shown include unfiled device data.  Last Pain:  Vitals:   08/24/23 0919  TempSrc:   PainSc: 0-No pain         Complications: No notable events documented.

## 2023-08-24 NOTE — Op Note (Signed)
 Operative Note  Preoperative diagnosis:  1.  3.3 cm left renal mass  Postoperative diagnosis: 1.  3.3 cm left renal mass  Procedure(s): 1.  Robot-assisted laparoscopic left partial nephrectomy 2.  Intraoperative ultrasound of single retroperitoneal organ  Surgeon: Lonni Han, MD  Assistants: Alan Hammonds, PA-C An assistant was required for this surgical procedure.  The duties of the assistant included but were not limited to suctioning, passing suture, camera manipulation, retraction.  This procedure would not be able to be performed without an Geophysicist/field seismologist.   Anesthesia:  General  Complications:  None  EBL: 50 mL  Specimens: 1.  Left renal mass  Drains/Catheters: 1.  Foley catheter  Intraoperative findings:   Grossly negative margins following excision of left renal mass Renorrhaphy was hemostatic at the conclusion of the case  Indication:  Michele King is a 68 y.o. female with a solid enhancing 3.3 cm left renal mass with features concerning for renal cell carcinoma.  She has been consented for the above procedures, voices understanding and wishes to proceed.  Description of procedure:  After informed consent was obtained, the patient was brought to the operating room and general endotracheal anesthesia was administered.  A 16 French Foley catheter was then sterilely placed and set to gravity drainage.  The patient was then placed in the right lateral decubitus position and prepped and draped in usual sterile fashion.  A timeout was performed.  An 8 mm incision was then made lateral to the left rectus muscle at the level of the left 12th rib.  Abdominal access was obtained via a Veress needle.  The abdominal cavity was then insufflated up to 15 mmHg.  An 8 mm port was then introduced into the abdominal cavity.  Inspection of the port entry site by the robotic camera revealed no adjacent organ injury.  We then placed 3 additional 8 mm robotic ports to triangulate  the left renal hilum.  A 12 mm assistant port was then placed between the carmera port and 3rd robotic arm.  The white line of Toldt along the descending colon was incised sharply and the colon, along with its mesocolonic fat, was reflected medially until the aorta was identified.  We then made a small window adjacent to the lower pole of the left kidney, identifying the left psoas muscle, left ureter and left gonadal vein.  The left ureter and gonadal vein were then reflected anteriorly allowing us  to then incised the perihilar attachments using electrocautery.  We encountered a small lumbar vein adjacent to the insertion of the left gonadal vein into the left renal vein.  This lumbar vein was ligated with hemo-lock clips in 2 places and incised sharply.  This provided us  excellent exposure to the left renal hilum.  The perilymphatic tissue surrounding the left renal artery was carefully dissected away, creating a window to place a bulldog clamp later in the procedure.  The anterior portion of Gerota's fascia was incised, allowing reflection the perinephric fat medially and laterally until there was adequate exposure of the anterior lower pole left renal mass.  Intraoperative ultrasound confirmed the heterogenous echogenicity of the lesion compared to the remainder of the renal parenchyma and allowed identification of the depth/borders of the mass, which were demarcated using electrocautery along the renal capsule.  We then exposed the left renal artery and placed a bulldog clamp, marking warm ischemia time.  The left kidney immediately became ischemic and pale in appearance.  The left renal mass was then sharply  excised with minimal blood loss.  After the mass was free, it was placed in the left upper quadrant to be retrieved later on during the operation.    The renorrhaphy was then performed using a series of 3-0 V-lock sutures in the deep layer of the renal parenchyma.  The bulldog clamp was then removed  marking warm ischemia time at 22 minutes.   A series of 1-0 Vicryl sutures with Hem-o-lok clips acting as a buttress were then used to reapproximate the renal capsule.  There did not appear to be any obvious bleeding around the renal hilum nor surrounding our repair.  The incised Gerota's fascia overlying the mass was then reapproximated using a running 2-0 V lock suture.  The mass was then placed in an Endo Catch bag.  Surgicel and Vistaseal  were then applied to the resection bed.  The robot was then de-docked and the camera was then reinserted into the assistant port. Laparoscopic graspers were then used to grab the string of the Endo Catch bag, which was brought out through the 12 mm assistant port.  The abdomen was then desufflated and all ports were removed.  The assistant port incision was then extended approximately 1-2 cm and the left renal mass, within the Endo Catch bag, was removed and sent to pathology for permanent section.  The fascia within the assistant port incision was then reapproximated using a 0 Vicryl suture.  The remainder of the incisions were then closed using 4-0 Monocryl and dressed appropriately.  Patient tolerated the procedure well and was transferred to the postanesthesia unit in stable condition.   Plan:  Monitor on the floor overnight

## 2023-08-24 NOTE — Discharge Instructions (Signed)

## 2023-08-24 NOTE — Anesthesia Procedure Notes (Signed)
 Procedure Name: Intubation Date/Time: 08/24/2023 11:21 AM  Performed by: Delores Duwaine SAUNDERS, CRNAPre-anesthesia Checklist: Patient identified, Emergency Drugs available, Suction available and Patient being monitored Patient Re-evaluated:Patient Re-evaluated prior to induction Oxygen Delivery Method: Circle System Utilized Preoxygenation: Pre-oxygenation with 100% oxygen Induction Type: IV induction Ventilation: Mask ventilation without difficulty Laryngoscope Size: Miller and 2 Grade View: Grade I Tube type: Oral Tube size: 6.5 mm Number of attempts: 1 Airway Equipment and Method: Stylet and Oral airway Placement Confirmation: ETT inserted through vocal cords under direct vision, positive ETCO2 and breath sounds checked- equal and bilateral Secured at: 22 (@teeth ) cm Tube secured with: Tape Dental Injury: Teeth and Oropharynx as per pre-operative assessment  Comments: No complications

## 2023-08-25 ENCOUNTER — Encounter (HOSPITAL_COMMUNITY): Payer: Self-pay | Admitting: Urology

## 2023-08-25 DIAGNOSIS — I10 Essential (primary) hypertension: Secondary | ICD-10-CM | POA: Diagnosis not present

## 2023-08-25 DIAGNOSIS — Z79899 Other long term (current) drug therapy: Secondary | ICD-10-CM | POA: Diagnosis not present

## 2023-08-25 DIAGNOSIS — F17220 Nicotine dependence, chewing tobacco, uncomplicated: Secondary | ICD-10-CM | POA: Diagnosis not present

## 2023-08-25 DIAGNOSIS — Z7984 Long term (current) use of oral hypoglycemic drugs: Secondary | ICD-10-CM | POA: Diagnosis not present

## 2023-08-25 DIAGNOSIS — E088 Diabetes mellitus due to underlying condition with unspecified complications: Secondary | ICD-10-CM | POA: Diagnosis not present

## 2023-08-25 DIAGNOSIS — C642 Malignant neoplasm of left kidney, except renal pelvis: Secondary | ICD-10-CM | POA: Diagnosis not present

## 2023-08-25 LAB — GLUCOSE, CAPILLARY
Glucose-Capillary: 110 mg/dL — ABNORMAL HIGH (ref 70–99)
Glucose-Capillary: 115 mg/dL — ABNORMAL HIGH (ref 70–99)
Glucose-Capillary: 150 mg/dL — ABNORMAL HIGH (ref 70–99)
Glucose-Capillary: 145 mg/dL — ABNORMAL HIGH (ref 70–99)

## 2023-08-25 LAB — BASIC METABOLIC PANEL WITH GFR
Anion gap: 6 (ref 5–15)
BUN: 8 mg/dL (ref 8–23)
CO2: 23 mmol/L (ref 22–32)
Calcium: 7.7 mg/dL — ABNORMAL LOW (ref 8.9–10.3)
Chloride: 99 mmol/L (ref 98–111)
Creatinine, Ser: 0.69 mg/dL (ref 0.44–1.00)
GFR, Estimated: >60 mL/min (ref >60)
Glucose, Bld: 128 mg/dL — ABNORMAL HIGH (ref 70–99)
Potassium: 3.7 mmol/L (ref 3.5–5.1)
Sodium: 128 mmol/L — ABNORMAL LOW (ref 135–145)

## 2023-08-25 LAB — HEMOGLOBIN AND HEMATOCRIT, BLOOD
HCT: 36.1 % (ref 36.0–46.0)
Hemoglobin: 11.3 g/dL — ABNORMAL LOW (ref 12.0–15.0)

## 2023-08-25 MED ORDER — CALCIUM CARBONATE ANTACID 500 MG PO CHEW
2.0000 | CHEWABLE_TABLET | Freq: Three times a day (TID) | ORAL | Status: DC
  Administered 2023-08-25 – 2023-08-26 (×4): 400 mg via ORAL
  Filled 2023-08-25 (×4): qty 2

## 2023-08-25 NOTE — Progress Notes (Signed)
 1 Day Post-Op Subjective: Patient reports feeling well.  Good pain control.  No flatus/N/V. Has been on bedrest. UO not recorded in chart but 600cc in foley bag.   Objective: Vital signs in last 24 hours: Temp:  [96.8 F (36 C)-99.1 F (37.3 C)] 97.7 F (36.5 C) (07/24 0444) Pulse Rate:  [65-81] 65 (07/24 0444) Resp:  [13-30] 20 (07/23 1640) BP: (108-142)/(50-73) 108/57 (07/24 0444) SpO2:  [86 %-96 %] 91 % (07/24 0444) Weight:  [85.7 kg] 85.7 kg (07/23 0919)  Intake/Output from previous day: 07/23 0701 - 07/24 0700 In: 2208 [I.V.:2108; IV Piggyback:100] Out: 575 [Urine:525; Blood:50] Intake/Output this shift: No intake/output data recorded.  Physical Exam:  General:alert, cooperative, and no distress Cardiovascular: RRR Lungs: faint crackles bases GI: soft, NT, ND,+BS Incisions: C/D/I Urine: clear  Extremities: SCDs in place  Lab Results: Recent Labs    08/22/23 1027 08/24/23 1434 08/25/23 0410  HGB 12.6 12.7 11.3*  HCT 42.0 43.0 36.1   BMET Recent Labs    08/22/23 1027 08/25/23 0410  NA 135 128*  K 5.3* 3.7  CL 100 99  CO2 29 23  GLUCOSE 93 128*  BUN 9 8  CREATININE 0.70 0.69  CALCIUM  8.8* 7.7*   No results for input(s): LABPT, INR in the last 72 hours. No results for input(s): LABURIN in the last 72 hours. Results for orders placed or performed during the hospital encounter of 03/20/23  Urine Culture     Status: Abnormal   Collection Time: 03/20/23  3:04 PM   Specimen: Urine, Clean Catch  Result Value Ref Range Status   Specimen Description URINE, CLEAN CATCH  Final   Special Requests   Final    NONE Performed at [facility], [address] [place], [place] [address]    Culture 60,000 COLONIES/mL KLEBSIELLA PNEUMONIAE (A)  Final   Report Status 03/22/2023 FINAL  Final   Organism ID, Bacteria KLEBSIELLA PNEUMONIAE (A)  Final      Susceptibility   Klebsiella pneumoniae - MIC*    AMPICILLIN >=32 RESISTANT Resistant     CEFAZOLIN   <=4 SENSITIVE Sensitive     CEFEPIME <=0.12 SENSITIVE Sensitive     CEFTRIAXONE <=0.25 SENSITIVE Sensitive     CIPROFLOXACIN <=0.25 SENSITIVE Sensitive     GENTAMICIN <=1 SENSITIVE Sensitive     IMIPENEM <=0.25 SENSITIVE Sensitive     NITROFURANTOIN 128 RESISTANT Resistant     TRIMETH/SULFA <=20 SENSITIVE Sensitive     AMPICILLIN/SULBACTAM >=32 RESISTANT Resistant     PIP/TAZO <=4 SENSITIVE Sensitive ug/mL    * 60,000 COLONIES/mL KLEBSIELLA PNEUMONIAE    Studies/Results: No results found.  Assessment/Plan: 1 Day Post-Op, Procedure(s) (LRB): NEPHRECTOMY, PARTIAL, ROBOT-ASSISTED (Left)  Ambulate, Incentive spirometry DVT prophylaxis Transition to PO pain medications SL IVF Advance diet Replace Ca with Tums WBC elevated--pt with known CLL  D/c foley with TOV    LOS: 0 days   Michele King 08/25/2023, 7:30 AM

## 2023-08-25 NOTE — Plan of Care (Signed)
 Pt decided to stay overnight charge aware

## 2023-08-25 NOTE — Care Management Obs Status (Signed)
 MEDICARE OBSERVATION STATUS NOTIFICATION   Patient Details  Name: Michele King MRN: [id number] Date of Birth: October 16, 1955   Medicare Observation Status Notification Given:  Yes    Duwaine GORMAN Aran, LCSW 08/25/2023, 2:54 PM

## 2023-08-25 NOTE — Progress Notes (Signed)
   08/25/23 2015  BiPAP/CPAP/SIPAP  BiPAP/CPAP/SIPAP Pt Type Adult (Prefers self placement)  BiPAP/CPAP/SIPAP DREAMSTATIOND  Mask Type Nasal mask (from home)  FiO2 (%) 21 %  Patient Home Machine No  Patient Home Mask Yes  Patient Home Tubing Yes  Auto Titrate Yes  Minimum cmH2O 5 cmH2O  Maximum cmH2O 10 cmH2O  CPAP/SIPAP surface wiped down Yes  Device Plugged into RED Power Outlet Yes  BiPAP/CPAP /SiPAP Vitals  Pulse Rate 73  Resp 16  SpO2 94 %  MEWS Score/Color  MEWS Score 0  MEWS Score Color Landy

## 2023-08-25 NOTE — Progress Notes (Signed)
   08/25/23 0941  TOC Brief Assessment  Insurance and Status Reviewed  Patient has primary care physician Yes  Home environment has been reviewed Resides alone in single family home  Prior level of function: Independent with ADLs at baseline  Prior/Current Home Services No current home services  Social Drivers of Health Review SDOH reviewed no interventions necessary  Readmission risk has been reviewed Yes  Transition of care needs no transition of care needs at this time

## 2023-08-25 NOTE — Progress Notes (Signed)
   08/25/23 0000  BiPAP/CPAP/SIPAP  $ Non-Invasive Home Ventilator  Initial  BiPAP/CPAP/SIPAP Pt Type Adult  BiPAP/CPAP/SIPAP DREAMSTATIOND  Mask Type Nasal mask  Respiratory Rate 18 breaths/min  Patient Home Machine No  Patient Home Mask Yes  Patient Home Tubing Yes  Auto Titrate Yes  Minimum cmH2O 5 cmH2O  Maximum cmH2O 10 cmH2O  Device Plugged into RED Power Outlet Yes

## 2023-08-26 DIAGNOSIS — Z7984 Long term (current) use of oral hypoglycemic drugs: Secondary | ICD-10-CM | POA: Diagnosis not present

## 2023-08-26 DIAGNOSIS — E088 Diabetes mellitus due to underlying condition with unspecified complications: Secondary | ICD-10-CM | POA: Diagnosis not present

## 2023-08-26 DIAGNOSIS — C642 Malignant neoplasm of left kidney, except renal pelvis: Secondary | ICD-10-CM | POA: Diagnosis not present

## 2023-08-26 DIAGNOSIS — F17220 Nicotine dependence, chewing tobacco, uncomplicated: Secondary | ICD-10-CM | POA: Diagnosis not present

## 2023-08-26 DIAGNOSIS — I10 Essential (primary) hypertension: Secondary | ICD-10-CM | POA: Diagnosis not present

## 2023-08-26 DIAGNOSIS — Z79899 Other long term (current) drug therapy: Secondary | ICD-10-CM | POA: Diagnosis not present

## 2023-08-26 LAB — SURGICAL PATHOLOGY

## 2023-08-26 LAB — GLUCOSE, CAPILLARY: Glucose-Capillary: 115 mg/dL — ABNORMAL HIGH (ref 70–99)

## 2023-08-26 NOTE — Discharge Summary (Signed)
 Date of admission: 08/24/2023  Date of discharge: 08/26/2023  Admission diagnosis: Left renal mass  Discharge diagnosis: same  Secondary diagnoses: CLL, anemia, Hashimoto's disease, DM, HLD  History and Physical: For full details, please see admission history and physical. Briefly, Michele King is a 68 y.o. year old patient with a solid and enhancing 3.3 cm left renal mass with features concerning for renal cell carcinoma. The lesion was initially identified on CT abdomen/pelvis with and without contrast on 06/23/2023 during evaluation for generalized abdominal pain.   Hospital Course: Pt was admitted and taken to the OR on 08/24/23 for a RAL left partial nephrectomy.  She tolerated the procedure well and was hemodynamically stable throughout.  Pt was extubated and woke up from anesthesia neurologically intact.  She was transferred from the OR to PACU and then to the floor without issue.  Post op course progressed as expected.  On POD 1 her Ca was noted to be low and was replaced with Tums.  Foley was removed and she was able to void. She had mild nausea with minimal flatus after lunch but was able to ambulate multiple times. On POD 2 she was doing very well.  She had a small, loose bowel movement, was voiding, and ambulating all with good pain control. She was felt stable for d/c home.   Laboratory values:  Recent Labs    08/24/23 1434 08/25/23 0410  HGB 12.7 11.3*  HCT 43.0 36.1   Recent Labs    08/25/23 0410  CREATININE 0.69    Disposition: Home  Discharge instruction: The patient was instructed to be ambulatory but told to refrain from heavy lifting, strenuous activity, or driving.   Discharge medications:  Allergies as of 08/26/2023   No Known Allergies      Medication List     STOP taking these medications    ergocalciferol 1.25 MG (50000 UT) capsule Commonly known as: VITAMIN D2       TAKE these medications    amLODipine -benazepril  5-10 MG  capsule Commonly known as: LOTREL Take 1 capsule by mouth daily.   DHEA 25 MG Caps Take 25 mg by mouth daily.   docusate sodium  100 MG capsule Commonly known as: COLACE Take 1 capsule (100 mg total) by mouth 2 (two) times daily.   escitalopram  10 MG tablet Commonly known as: LEXAPRO  Take 10 mg by mouth daily.   estradiol  1 MG tablet Commonly known as: ESTRACE  Take 1 mg by mouth daily.   fluticasone 50 MCG/ACT nasal spray Commonly known as: FLONASE Place 2 sprays into both nostrils daily.   hydrochlorothiazide 12.5 MG capsule Commonly known as: MICROZIDE Take 12.5 mg by mouth daily.   HYDROcodone -acetaminophen  5-325 MG tablet Commonly known as: NORCO/VICODIN Take 1-2 tablets by mouth every 6 (six) hours as needed for moderate pain (pain score 4-6) or severe pain (pain score 7-10).   ketotifen 0.035 % ophthalmic solution Commonly known as: ZADITOR Place 1 drop into both eyes in the morning.   levothyroxine  125 MCG tablet Commonly known as: SYNTHROID  Take 125 mcg by mouth daily before breakfast.   metFORMIN 500 MG 24 hr tablet Commonly known as: GLUCOPHAGE-XR Take 500 mg by mouth 2 (two) times daily.   nitroGLYCERIN  0.4 MG SL tablet Commonly known as: NITROSTAT  Place 1 tablet (0.4 mg total) under the tongue every 5 (five) minutes as needed for chest pain.   NON FORMULARY Pt uses a c-pap nightly   progesterone  200 MG capsule Commonly known as: PROMETRIUM   Take 200 mg by mouth at bedtime.   RABEprazole 20 MG tablet Commonly known as: ACIPHEX Take 20 mg by mouth daily.   rosuvastatin  5 MG tablet Commonly known as: CRESTOR  Take 5 mg by mouth daily. What changed: Another medication with the same name was changed. Make sure you understand how and when to take each.   rosuvastatin  10 MG tablet Commonly known as: CRESTOR  Take 1 tablet (10 mg total) by mouth daily. What changed: how much to take   sodium chloride  0.65 % Soln nasal spray Commonly known as:  OCEAN Place 1 spray into both nostrils as needed for congestion.        Followup:   Follow-up Information     Devere Lonni Righter, MD Follow up on 09/05/2023.   Specialty: Urology Why: at 2:15 Contact information: [address] [place] [address] [place] [address] [phone]

## 2023-09-01 DIAGNOSIS — G4733 Obstructive sleep apnea (adult) (pediatric): Secondary | ICD-10-CM | POA: Diagnosis not present

## 2023-09-05 DIAGNOSIS — C642 Malignant neoplasm of left kidney, except renal pelvis: Secondary | ICD-10-CM | POA: Diagnosis not present

## 2023-09-05 DIAGNOSIS — Z85828 Personal history of other malignant neoplasm of skin: Secondary | ICD-10-CM | POA: Diagnosis not present

## 2023-09-06 DIAGNOSIS — I10 Essential (primary) hypertension: Secondary | ICD-10-CM | POA: Diagnosis not present

## 2023-09-06 DIAGNOSIS — Z6834 Body mass index (BMI) 34.0-34.9, adult: Secondary | ICD-10-CM | POA: Diagnosis not present

## 2023-09-06 DIAGNOSIS — F330 Major depressive disorder, recurrent, mild: Secondary | ICD-10-CM | POA: Diagnosis not present

## 2023-09-06 DIAGNOSIS — R197 Diarrhea, unspecified: Secondary | ICD-10-CM | POA: Diagnosis not present

## 2023-09-06 DIAGNOSIS — K219 Gastro-esophageal reflux disease without esophagitis: Secondary | ICD-10-CM | POA: Diagnosis not present

## 2023-09-06 DIAGNOSIS — E1169 Type 2 diabetes mellitus with other specified complication: Secondary | ICD-10-CM | POA: Diagnosis not present

## 2023-09-06 DIAGNOSIS — Z85528 Personal history of other malignant neoplasm of kidney: Secondary | ICD-10-CM | POA: Diagnosis not present

## 2023-09-21 ENCOUNTER — Telehealth: Payer: Self-pay | Admitting: Pharmacist

## 2023-09-21 NOTE — Progress Notes (Signed)
 Pharmacy Quality Measure Review  This patient is appearing on a report for being at risk of failing the adherence measure for cholesterol (statin), diabetes, and hypertension (ACEi/ARB) medications this calendar year.   MAD: metformin ER 500 mg #90 filled 07/12/2023, next refill due 10/12/2023 - metformin discontinued at OV on 09/06/2023. Patient will follow up on 10/05/2023.  MAC: rosuvastatin  5 mg #84 filled 07/13/2023, next refill due 10/12/2023  MAH: amlodipine -benazepril  5-10 mg #90 filled 06/03/2023, next refill due 09/03/2023   Reviewed medication indication, dosing, and goals of therapy.   Telephonic engagement with Michele King today, reviewed medication list in full. Metformin ER 500 mg PO twice daily was held at last office visit on 09/06/2023 due to adverse GI effects patient experienced. Patient reports that symptoms have improved but not entirely. Patient reports that the diarrhea she had previously was about once per day early in the day. She reports that she had always taken Metformin ER 500 mg 2 tablets together in the morning with no adverse effects for 11 years prior to this occurring. Patient wonders if this could be due to addition of escitalopram  and metformin together. Provider is aware of both and removed metformin as a trial before the escitalopram .  Patient expresses desire today to restart metformin after follow up with Michele King for control of HgBA1c. Patient also desires to wean off of escitalopram  in the future. Provider is aware and has plan for patient. Patient has office visit follow on 10/05/2023.  Inquired with patient on amlodipine -benazepril  as medication is past due for refill on review of pharmacy claims. Patient confirms that she is filling medication and reports no adverse effects.   Will notify Michele King that I have spoken with patient today and patient is not longer taking over the counter Pepcid as symptoms have resolved. Will recommend when restarting  Metformin, consider having patient separate dosing to twice daily instead of all at once.   Michele King, PharmD Clinical Pharmacist [place] Direct Dial: [phone]

## 2023-10-05 DIAGNOSIS — I10 Essential (primary) hypertension: Secondary | ICD-10-CM | POA: Diagnosis not present

## 2023-10-05 DIAGNOSIS — Z6834 Body mass index (BMI) 34.0-34.9, adult: Secondary | ICD-10-CM | POA: Diagnosis not present

## 2023-10-05 DIAGNOSIS — E038 Other specified hypothyroidism: Secondary | ICD-10-CM | POA: Diagnosis not present

## 2023-10-05 DIAGNOSIS — E785 Hyperlipidemia, unspecified: Secondary | ICD-10-CM | POA: Diagnosis not present

## 2023-10-05 DIAGNOSIS — E1169 Type 2 diabetes mellitus with other specified complication: Secondary | ICD-10-CM | POA: Diagnosis not present

## 2023-10-05 DIAGNOSIS — F32A Depression, unspecified: Secondary | ICD-10-CM | POA: Diagnosis not present

## 2023-10-05 DIAGNOSIS — Z1231 Encounter for screening mammogram for malignant neoplasm of breast: Secondary | ICD-10-CM | POA: Diagnosis not present

## 2023-10-05 DIAGNOSIS — L659 Nonscarring hair loss, unspecified: Secondary | ICD-10-CM | POA: Diagnosis not present

## 2023-10-05 DIAGNOSIS — H029 Unspecified disorder of eyelid: Secondary | ICD-10-CM | POA: Diagnosis not present

## 2023-10-06 DIAGNOSIS — C642 Malignant neoplasm of left kidney, except renal pelvis: Secondary | ICD-10-CM | POA: Diagnosis not present

## 2023-10-12 ENCOUNTER — Encounter (HOSPITAL_BASED_OUTPATIENT_CLINIC_OR_DEPARTMENT_OTHER): Payer: Self-pay | Admitting: Emergency Medicine

## 2023-10-12 ENCOUNTER — Ambulatory Visit (HOSPITAL_BASED_OUTPATIENT_CLINIC_OR_DEPARTMENT_OTHER): Admission: EM | Admit: 2023-10-12 | Discharge: 2023-10-12 | Disposition: A | Discharge status: Home / Self Care

## 2023-10-12 DIAGNOSIS — T50905A Adverse effect of unspecified drugs, medicaments and biological substances, initial encounter: Secondary | ICD-10-CM

## 2023-10-12 DIAGNOSIS — R112 Nausea with vomiting, unspecified: Secondary | ICD-10-CM

## 2023-10-12 MED ORDER — ONDANSETRON HCL 4 MG/2ML IJ SOLN
4.0000 mg | Freq: Once | INTRAMUSCULAR | Status: AC
  Administered 2023-10-12: 4 mg via INTRAMUSCULAR

## 2023-10-12 MED ORDER — ONDANSETRON 4 MG PO TBDP: 4.0000 mg | ORAL_TABLET | Freq: Three times a day (TID) | ORAL | 0 refills | Status: AC | PRN

## 2023-10-12 NOTE — Discharge Instructions (Addendum)
 Nausea and vomiting secondary to Ozempic use/medication reaction: Ondansetron  4 mg injection now.  Ondansetron  4 mg ODT, every 8 hours as needed for nausea or vomiting, melts on tongue.  Could alternate ondansetron  with promethazine  or could use in the place of promethazine .  Ozempic use: Ozempic is typically started out at 0.25 mg, 1 dose once weekly for a month and then Ozempic 0.5 mg, once weekly for a month and then Ozempic 1 mg, once weekly for 1 to 2 months and eventually titrate up to Ozempic 2 mg weekly.  Patient encouraged that Ozempic could be helpful and healthy.  Encouraged to talk to her primary care provider about possibly using a lower dose initially and eventually titrating up to a higher dose.  Follow-up here if needed but definitely follow-up with primary care.

## 2023-10-12 NOTE — ED Provider Notes (Signed)
 [facility]    CSN: [id number] Arrival date & time: 10/12/23  1708      History   Chief Complaint No chief complaint on file.   HPI [facility] Michele King is a 68 y.o. female.   68 year old female who was started on Ozempic on 10/11/2023 by primary care.  Her dose of Ozempic was 2 mg once a week and she took her first dose on 10/11/2023.  She proceeded to have persistent vomiting all through the night and is vomiting every 30 minutes to 2 hours.  She is vomiting bile.  She spoke to her doctor's office and they encouraged her to come to urgent care and get some kind of a shot for the nausea and vomiting.  She was given promethazine  suppository by her doctor but it did not seem to be helping.     Past Medical History:  Diagnosis Date   Anemia    Anxiety    Arthritis    Calculus of gallbladder with chronic cholecystitis without obstruction 05/21/2019   Chronic lymphatic leukemia (HCC)    Diabetes mellitus without complication (HCC)    Elevated cholesterol    Encounter for screening colonoscopy 05/21/2019   Environmental allergies    GERD (gastroesophageal reflux disease)    Hashimoto's disease    History of kidney stones    Hyperlipidemia    Hypertension    Hypothyroidism    Obesity    RUQ pain 05/21/2019   Symptomatic menopausal or female climacteric states    Varicose veins of bilateral lower extremities with other complications    Venous insufficiency of both lower extremities     Patient Active Problem List   Diagnosis Date Noted   Renal mass 08/24/2023   Iron  deficiency anemia 06/06/2023   History of colonic polyps 02/11/2020   Diabetes mellitus due to underlying condition with unspecified complications (HCC) 12/20/2019   Obesity (BMI 30.0-34.9) 11/09/2019   Thyroid  disease    Hyperlipidemia    Obesity    Varicose veins of bilateral lower extremities with other complications    Venous insufficiency of both lower extremities    Symptomatic menopausal  or female climacteric states    Environmental allergies    Elevated cholesterol    Chronic lymphatic leukemia (HCC)    Hashimoto's disease    Postoperative examination 06/19/2019   BMI 34.0-34.9,adult 05/21/2019   Calculus of gallbladder with chronic cholecystitis without obstruction 05/21/2019   Encounter for screening colonoscopy 05/21/2019   RUQ pain 05/21/2019   Special screening examination for viral disease 05/21/2019   Chest discomfort 04/2009    Past Surgical History:  Procedure Laterality Date   CHOLECYSTECTOMY  06/2019   DILATION AND CURETTAGE OF UTERUS     ROBOTIC ASSITED PARTIAL NEPHRECTOMY Left 08/24/2023   Procedure: NEPHRECTOMY, PARTIAL, ROBOT-ASSISTED;  Surgeon: Devere Lonni Righter, MD;  Location: [facility];  Service: Urology;  Laterality: Left;  LEFT ROBOTIC PARTIAL, POSSIBLE RADICAL NEPHRECTOMY   VEIN LIGATION AND STRIPPING  1989    OB History   No obstetric history on file.      Home Medications    Prior to Admission medications   Medication Sig Start Date End Date Taking? Authorizing Provider  amLODipine -benazepril  (LOTREL) 5-10 MG capsule Take 1 capsule by mouth daily. 02/10/23  Yes [provider]  DHEA 25 MG CAPS Take 25 mg by mouth daily.   Yes [provider]  escitalopram  (LEXAPRO ) 10 MG tablet Take 10 mg by mouth daily.   Yes [provider]  estradiol  (ESTRACE ) 1 MG tablet Take 1 mg by mouth daily. 05/09/23  Yes [provider]  hydrochlorothiazide (MICROZIDE) 12.5 MG capsule Take 12.5 mg by mouth daily. 05/11/23  Yes [provider]  levothyroxine  (SYNTHROID ) 125 MCG tablet Take 125 mcg by mouth daily before breakfast. 04/22/23  Yes [provider]  ondansetron  (ZOFRAN -ODT) 4 MG disintegrating tablet Take 1 tablet (4 mg total) by mouth every 8 (eight) hours as needed for nausea or vomiting. 10/12/23  Yes Ival Domino, FNP  progesterone  (PROMETRIUM ) 200 MG capsule Take 200 mg by mouth at bedtime.  05/09/23  Yes [provider]  PROMETHEGAN 25 MG suppository SMARTSIG:1 SUPPOS Rectally Every 12 Hours PRN 10/11/23  Yes [provider]  RABEprazole (ACIPHEX) 20 MG tablet Take 20 mg by mouth daily.     Yes [provider]  rosuvastatin  (CRESTOR ) 5 MG tablet Take 5 mg by mouth daily.   Yes [provider]  docusate sodium  (COLACE) 100 MG capsule Take 1 capsule (100 mg total) by mouth 2 (two) times daily. 08/24/23   Cory Palma, PA-C  fluticasone (FLONASE) 50 MCG/ACT nasal spray Place 2 sprays into both nostrils daily.    [provider]  HYDROcodone -acetaminophen  (NORCO/VICODIN) 5-325 MG tablet Take 1-2 tablets by mouth every 6 (six) hours as needed for moderate pain (pain score 4-6) or severe pain (pain score 7-10). 08/24/23   Cory Palma, PA-C  ketotifen (ZADITOR) 0.035 % ophthalmic solution Place 1 drop into both eyes in the morning.    [provider]  metFORMIN (GLUCOPHAGE-XR) 500 MG 24 hr tablet Take 500 mg by mouth 2 (two) times daily. 05/09/23   [provider]  nitroGLYCERIN  (NITROSTAT ) 0.4 MG SL tablet Place 1 tablet (0.4 mg total) under the tongue every 5 (five) minutes as needed for chest pain. Patient not taking: Reported on 08/19/2023 11/09/19 10/29/20  Revankar, Jennifer SAUNDERS, MD  NON FORMULARY Pt uses a c-pap nightly    [provider]  rosuvastatin  (CRESTOR ) 10 MG tablet Take 1 tablet (10 mg total) by mouth daily. Patient taking differently: Take 5 mg by mouth daily. 11/30/19 10/29/20  Revankar, Rajan R, MD  sodium chloride  (OCEAN) 0.65 % SOLN nasal spray Place 1 spray into both nostrils as needed for congestion.    [provider]    Family History Family History  Problem Relation Age of Onset   Cancer Mother    Arthritis Mother    Arthritis Father    Heart attack Father    Mitral valve prolapse Sister     Social History Social History   Tobacco Use   Smoking status: Never   Smokeless tobacco: Never   Vaping Use   Vaping status: Never Used  Substance Use Topics   Alcohol use: Never   Drug use: Never     Allergies   Ozempic (0.25 or 0.5 mg-dose) [semaglutide(0.25 or 0.5mg -dos)]   Review of Systems Review of Systems  Constitutional:  Negative for chills and fever.  HENT:  Negative for ear pain and sore throat.   Eyes:  Negative for pain and visual disturbance.  Respiratory:  Negative for cough and shortness of breath.   Cardiovascular:  Negative for chest pain and palpitations.  Gastrointestinal:  Positive for abdominal pain, nausea and vomiting. Negative for constipation and diarrhea.  Genitourinary:  Negative for dysuria and hematuria.  Musculoskeletal:  Negative for arthralgias and back pain.  Skin:  Negative for color change and rash.  Neurological:  Negative for  seizures and syncope.  All other systems reviewed and are negative.    Physical Exam Triage Vital Signs ED Triage Vitals  Encounter Vitals Group     BP 10/12/23 1844 137/81     Girls Systolic BP Percentile --      Girls Diastolic BP Percentile --      Boys Systolic BP Percentile --      Boys Diastolic BP Percentile --      Pulse Rate 10/12/23 1844 74     Resp 10/12/23 1844 18     Temp 10/12/23 1844 98.2 F (36.8 C)     Temp Source 10/12/23 1844 Oral     SpO2 10/12/23 1844 95 %     Weight --      Height --      Head Circumference --      Peak Flow --      Pain Score 10/12/23 1841 0     Pain Loc --      Pain Education --      Exclude from Growth Chart --    No data found.  Updated Vital Signs BP 137/81 (BP Location: Right Arm)   Pulse 74   Temp 98.2 F (36.8 C) (Oral)   Resp 18   SpO2 95%   Visual Acuity Right Eye Distance:   Left Eye Distance:   Bilateral Distance:    Right Eye Near:   Left Eye Near:    Bilateral Near:     Physical Exam Vitals and nursing note reviewed.  Constitutional:      General: She is not in acute distress.    Appearance: She is well-developed. She is not  ill-appearing or toxic-appearing.  HENT:     Head: Normocephalic and atraumatic.     Right Ear: Hearing, tympanic membrane, ear canal and external ear normal.     Left Ear: Hearing, tympanic membrane, ear canal and external ear normal.     Nose: No congestion or rhinorrhea.     Right Sinus: No maxillary sinus tenderness or frontal sinus tenderness.     Left Sinus: No maxillary sinus tenderness or frontal sinus tenderness.     Mouth/Throat:     Lips: Pink.     Mouth: Mucous membranes are moist.     Pharynx: Uvula midline. No oropharyngeal exudate or posterior oropharyngeal erythema.     Tonsils: No tonsillar exudate.  Eyes:     Conjunctiva/sclera: Conjunctivae normal.     Pupils: Pupils are equal, round, and reactive to light.  Cardiovascular:     Rate and Rhythm: Normal rate and regular rhythm.     Heart sounds: S1 normal and S2 normal. No murmur heard. Pulmonary:     Effort: Pulmonary effort is normal. No respiratory distress.     Breath sounds: Normal breath sounds. No decreased breath sounds, wheezing, rhonchi or rales.  Abdominal:     General: Bowel sounds are normal.     Palpations: Abdomen is soft.     Tenderness: There is abdominal tenderness (Mild to moderate) in the right upper quadrant, right lower quadrant, epigastric area, left upper quadrant and left lower quadrant. There is no right CVA tenderness, left CVA tenderness, guarding or rebound. Negative signs include Murphy's sign, Rovsing's sign and McBurney's sign.  Musculoskeletal:        General: No swelling.     Cervical back: Neck supple.  Lymphadenopathy:     Head:     Right side of head: No submental, submandibular, tonsillar, preauricular  or posterior auricular adenopathy.     Left side of head: No submental, submandibular, tonsillar, preauricular or posterior auricular adenopathy.     Cervical: No cervical adenopathy.     Right cervical: No superficial cervical adenopathy.    Left cervical: No superficial  cervical adenopathy.  Skin:    General: Skin is warm and dry.     Capillary Refill: Capillary refill takes less than 2 seconds.     Findings: No rash.  Neurological:     Mental Status: She is alert and oriented to person, place, and time.  Psychiatric:        Mood and Affect: Mood normal.      UC Treatments / Results  Labs (all labs ordered are listed, but only abnormal results are displayed) Labs Reviewed - No data to display  EKG   Radiology No results found.  Procedures Procedures (including critical care time)  Medications Ordered in UC Medications  ondansetron  (ZOFRAN ) injection 4 mg (4 mg Intramuscular Given 10/12/23 1928)    Initial Impression / Assessment and Plan / UC Course  I have reviewed the triage vital signs and the nursing notes.  Pertinent labs & imaging results that were available during my care of the patient were reviewed by me and considered in my medical decision making (see chart for details).  Plan of Care: Nausea and vomiting secondary to medication reaction to Ozempic injections: Ondansetron  4 mg injection now.  Ondansetron  4 mg ODT, every 8 hours as needed for nausea and vomiting.  Educated about use of Ozempic and side effects.  Encouraged clear liquids for the next 2 days until she gets past the vomiting.  Encouraged to use the ondansetron  every 8 hours for 1 to 2 days to help her through this acute phase.  Advised her to speak to primary care about her Ozempic.  Typically Ozempic is started at 0.25 mg once a week and titrated up slowly.  She was somehow started on 2 mg weekly which is the highest dose possible and likely the cause of her nausea and vomiting  Reviewed signs and symptoms of dehydration and reasons to return here or go to an emergency room.  Follow-up here as needed.  I reviewed the plan of care with the patient and/or the patient's guardian.  The patient and/or guardian had time to ask questions and acknowledged that the questions  were answered.  I provided instruction on symptoms or reasons to return here or to go to an ER, if symptoms/condition did not improve, worsened or if new symptoms occurred.  Final Clinical Impressions(s) / UC Diagnoses   Final diagnoses:  Nausea and vomiting, unspecified vomiting type  Adverse effect of drug, initial encounter     Discharge Instructions      Nausea and vomiting secondary to Ozempic use/medication reaction: Ondansetron  4 mg injection now.  Ondansetron  4 mg ODT, every 8 hours as needed for nausea or vomiting, melts on tongue.  Could alternate ondansetron  with promethazine  or could use in the place of promethazine .  Ozempic use: Ozempic is typically started out at 0.25 mg, 1 dose once weekly for a month and then Ozempic 0.5 mg, once weekly for a month and then Ozempic 1 mg, once weekly for 1 to 2 months and eventually titrate up to Ozempic 2 mg weekly.  Patient encouraged that Ozempic could be helpful and healthy.  Encouraged to talk to her primary care provider about possibly using a lower dose initially and eventually titrating up to a  higher dose.  Follow-up here if needed but definitely follow-up with primary care.     ED Prescriptions     Medication Sig Dispense Auth. Provider   ondansetron  (ZOFRAN -ODT) 4 MG disintegrating tablet Take 1 tablet (4 mg total) by mouth every 8 (eight) hours as needed for nausea or vomiting. 20 tablet Michele Sindt, FNP      PDMP not reviewed this encounter.   Ival Domino, FNP 10/17/23 [phone]

## 2023-10-12 NOTE — ED Triage Notes (Signed)
 Pt reports she took Ozempic yesterday and every 2 hrs she would vomit she was prescribed a suppository to help her, it didn't help because every 2 hrs last night she would throw up. She reports from 10:30 to 4 pm she did vomit. Pt called her doctor and they advised to come to urgent care and get a shot.

## 2023-10-19 DIAGNOSIS — K219 Gastro-esophageal reflux disease without esophagitis: Secondary | ICD-10-CM | POA: Diagnosis not present

## 2023-10-19 DIAGNOSIS — R1112 Projectile vomiting: Secondary | ICD-10-CM | POA: Diagnosis not present

## 2023-10-19 DIAGNOSIS — E1169 Type 2 diabetes mellitus with other specified complication: Secondary | ICD-10-CM | POA: Diagnosis not present

## 2023-10-19 DIAGNOSIS — F3340 Major depressive disorder, recurrent, in remission, unspecified: Secondary | ICD-10-CM | POA: Diagnosis not present

## 2023-10-19 DIAGNOSIS — E860 Dehydration: Secondary | ICD-10-CM | POA: Diagnosis not present

## 2023-10-19 DIAGNOSIS — Z79899 Other long term (current) drug therapy: Secondary | ICD-10-CM | POA: Diagnosis not present

## 2023-10-19 DIAGNOSIS — Z6833 Body mass index (BMI) 33.0-33.9, adult: Secondary | ICD-10-CM | POA: Diagnosis not present

## 2023-10-21 DIAGNOSIS — E871 Hypo-osmolality and hyponatremia: Secondary | ICD-10-CM | POA: Diagnosis not present

## 2023-10-21 DIAGNOSIS — E861 Hypovolemia: Secondary | ICD-10-CM | POA: Diagnosis not present

## 2023-10-24 DIAGNOSIS — E871 Hypo-osmolality and hyponatremia: Secondary | ICD-10-CM | POA: Diagnosis not present

## 2023-10-24 DIAGNOSIS — E876 Hypokalemia: Secondary | ICD-10-CM | POA: Diagnosis not present

## 2023-11-10 DIAGNOSIS — E1169 Type 2 diabetes mellitus with other specified complication: Secondary | ICD-10-CM | POA: Diagnosis not present

## 2023-11-10 DIAGNOSIS — I10 Essential (primary) hypertension: Secondary | ICD-10-CM | POA: Diagnosis not present

## 2023-11-10 DIAGNOSIS — Z6833 Body mass index (BMI) 33.0-33.9, adult: Secondary | ICD-10-CM | POA: Diagnosis not present

## 2023-11-10 DIAGNOSIS — E785 Hyperlipidemia, unspecified: Secondary | ICD-10-CM | POA: Diagnosis not present

## 2023-11-10 DIAGNOSIS — K219 Gastro-esophageal reflux disease without esophagitis: Secondary | ICD-10-CM | POA: Diagnosis not present

## 2023-11-10 DIAGNOSIS — M79673 Pain in unspecified foot: Secondary | ICD-10-CM | POA: Diagnosis not present

## 2023-11-10 DIAGNOSIS — L84 Corns and callosities: Secondary | ICD-10-CM | POA: Diagnosis not present

## 2023-11-24 ENCOUNTER — Other Ambulatory Visit: Payer: Self-pay | Admitting: Oncology

## 2023-11-24 NOTE — Progress Notes (Unsigned)
 [facility]  [address] [place],  [place]  [address] [phone]  Clinic Day:  05/27/2023  Referring physician: Jefferey Fitch, MD  HISTORY OF PRESENT ILLNESS:  The patient is a 68 y.o. female with chronic lymphocytic leukemia, diagnosed per flow cytometry of her peripheral blood in August 2013.  Despite her white count being high over these past years, it had held fairly stable over her past visits without her other cell lines being affected by her disease.  At her last visit, she was found to have iron  deficiency anemia for which she was partially given IV iron  in May 2025.   She comes in today for routine follow up.  Since her last visit, the patient has been doing fine.  She continues to deny having any B symptoms or bulky lymphadenopathy which concerns her for catabolic progression of her CLL.   PHYSICAL EXAM:  There were no vitals taken for this visit. Wt Readings from Last 3 Encounters:  08/24/23 189 lb (85.7 kg)  08/22/23 189 lb 3.2 oz (85.8 kg)  05/27/23 192 lb 11.2 oz (87.4 kg)   There is no height or weight on file to calculate BMI. Performance status (ECOG): 0 - Asymptomatic Physical Exam Constitutional:      Appearance: Normal appearance. She is not ill-appearing.  HENT:     Mouth/Throat:     Mouth: Mucous membranes are moist.     Pharynx: Oropharynx is clear. No oropharyngeal exudate or posterior oropharyngeal erythema.  Cardiovascular:     Rate and Rhythm: Normal rate and regular rhythm.     Heart sounds: No murmur heard.    No friction rub. No gallop.  Pulmonary:     Effort: Pulmonary effort is normal. No respiratory distress.     Breath sounds: Normal breath sounds. No wheezing, rhonchi or rales.  Abdominal:     General: Bowel sounds are normal. There is no distension.     Palpations: Abdomen is soft. There is no mass.     Tenderness: There is no abdominal tenderness.  Musculoskeletal:        General: No swelling.      Right lower leg: No edema.     Left lower leg: No edema.  Lymphadenopathy:     Cervical: No cervical adenopathy.     Upper Body:     Right upper body: No supraclavicular or axillary adenopathy.     Left upper body: No supraclavicular or axillary adenopathy.     Lower Body: No right inguinal adenopathy. No left inguinal adenopathy.  Skin:    General: Skin is warm.     Coloration: Skin is not jaundiced.     Findings: No lesion or rash.  Neurological:     General: No focal deficit present.     Mental Status: She is alert and oriented to person, place, and time. Mental status is at baseline.  Psychiatric:        Mood and Affect: Mood normal.        Behavior: Behavior normal.        Thought Content: Thought content normal.    LABS:    Latest Reference Range & Units 05/27/23 14:37  WBC 4.0 - 10.5 K/uL 88.2 (HH)  RBC 3.87 - 5.11 MIL/uL 4.51  Hemoglobin 12.0 - 15.0 g/dL 88.1 (L)  HCT 63.9 - 53.9 % 38.0  MCV 80.0 - 100.0 fL 84.3  MCH 26.0 - 34.0 pg 26.2  MCHC 30.0 - 36.0 g/dL 31.1  RDW 11.5 - 15.5 % 14.7  Platelets 150 - 400 K/uL 188  nRBC 0.0 - 0.2 % 0 /100 WBC 0.00 0  Neutrophils % 6  Lymphocytes % 92  Monocytes Relative % 2  Eosinophil % 0  Basophil % 0  Immature Granulocytes % 0  (HH): Data is critically high (L): Data is abnormally low  Latest Reference Range & Units 05/27/23 14:37  Iron  28 - 170 ug/dL 37  UIBC ug/dL 584  TIBC 749 - 549 ug/dL 547 (H)  Saturation Ratios 10.4 - 31.8 % 8 (L)  Ferritin 11 - 307 ng/mL 12  Folate >5.9 ng/mL 11.0  Vitamin B12 180 - 914 pg/mL 278  (H): Data is abnormally high (L): Data is abnormally low   ASSESSMENT & PLAN:  Assessment/Plan:  A 68 y.o. female with chronic lymphocytic leukemia.  When comparing her labs today to what they have been in the past, her elevated white count is essentially unchanged versus her last visit.  She is mildly anemic today.  Her labs today do show that she is iron  deficient.  Based upon this, I will  arrange for her to receive IV iron  over the next few weeks to replenish her iron  stores and improve her hemoglobin.  I will see her back in 3 months to see how well she responded to her upcoming IV iron .  The patient understands all the plans discussed today and is in agreement with them.    Rino Hosea DELENA Kerns, MD

## 2023-11-25 ENCOUNTER — Inpatient Hospital Stay: Attending: Oncology

## 2023-11-25 ENCOUNTER — Inpatient Hospital Stay: Admitting: Oncology

## 2023-11-25 ENCOUNTER — Telehealth: Payer: Self-pay | Admitting: Oncology

## 2023-11-25 ENCOUNTER — Other Ambulatory Visit: Payer: Self-pay | Admitting: Oncology

## 2023-11-25 VITALS — BP 113/56 | HR 70 | Temp 97.9°F | Resp 14 | Ht 62.0 in | Wt 180.2 lb

## 2023-11-25 DIAGNOSIS — C9110 Chronic lymphocytic leukemia of B-cell type not having achieved remission: Secondary | ICD-10-CM

## 2023-11-25 DIAGNOSIS — Z905 Acquired absence of kidney: Secondary | ICD-10-CM | POA: Diagnosis not present

## 2023-11-25 DIAGNOSIS — C642 Malignant neoplasm of left kidney, except renal pelvis: Secondary | ICD-10-CM | POA: Insufficient documentation

## 2023-11-25 LAB — CBC WITH DIFFERENTIAL (CANCER CENTER ONLY)
Abs Immature Granulocytes: 0.12 K/uL — ABNORMAL HIGH (ref 0.00–0.07)
Basophils Absolute: 0.3 K/uL — ABNORMAL HIGH (ref 0.0–0.1)
Basophils Relative: 0 %
Eosinophils Absolute: 0.2 K/uL (ref 0.0–0.5)
Eosinophils Relative: 0 %
HCT: 42.4 % (ref 36.0–46.0)
Hemoglobin: 13.2 g/dL (ref 12.0–15.0)
Immature Granulocytes: 0 %
Lymphocytes Relative: 92 %
Lymphs Abs: 63.7 K/uL — ABNORMAL HIGH (ref 0.7–4.0)
MCH: 26.4 pg (ref 26.0–34.0)
MCHC: 31.1 g/dL (ref 30.0–36.0)
MCV: 84.8 fL (ref 80.0–100.0)
Monocytes Absolute: 0.6 K/uL (ref 0.1–1.0)
Monocytes Relative: 1 %
Neutro Abs: 4.8 K/uL (ref 1.7–7.7)
Neutrophils Relative %: 7 %
Platelet Count: 192 K/uL (ref 150–400)
RBC: 5 MIL/uL (ref 3.87–5.11)
RDW: 15.2 % (ref 11.5–15.5)
Smear Review: NORMAL
WBC Count: 69.7 K/uL (ref 4.0–10.5)
nRBC: 0 % (ref 0.0–0.2)

## 2023-11-25 NOTE — Progress Notes (Signed)
 CRITICAL VALUE STICKER  CRITICAL VALUE:  WBC 69.7  RECEIVER (on-site recipient of call):  Woodie Kapur RN  DATE & TIME NOTIFIED:   11/25/2023 @ 0959  MESSENGER (representative from lab):  [facility]  MD NOTIFIED:   Dr. Ezzard  TIME OF NOTIFICATION:  1000  RESPONSE:  scheduled to see patient.

## 2023-11-25 NOTE — Telephone Encounter (Signed)
 Patient has been scheduled for follow-up visit per 11/25/23 LOS.  Pt given an appt calendar with date and time.

## 2023-12-02 DIAGNOSIS — Z1231 Encounter for screening mammogram for malignant neoplasm of breast: Secondary | ICD-10-CM | POA: Diagnosis not present

## 2023-12-05 DIAGNOSIS — Z1231 Encounter for screening mammogram for malignant neoplasm of breast: Secondary | ICD-10-CM | POA: Diagnosis not present

## 2023-12-07 DIAGNOSIS — E785 Hyperlipidemia, unspecified: Secondary | ICD-10-CM | POA: Diagnosis not present

## 2023-12-07 DIAGNOSIS — L84 Corns and callosities: Secondary | ICD-10-CM | POA: Diagnosis not present

## 2023-12-07 DIAGNOSIS — E038 Other specified hypothyroidism: Secondary | ICD-10-CM | POA: Diagnosis not present

## 2023-12-07 DIAGNOSIS — K219 Gastro-esophageal reflux disease without esophagitis: Secondary | ICD-10-CM | POA: Diagnosis not present

## 2023-12-07 DIAGNOSIS — Z6833 Body mass index (BMI) 33.0-33.9, adult: Secondary | ICD-10-CM | POA: Diagnosis not present

## 2023-12-07 DIAGNOSIS — Z79899 Other long term (current) drug therapy: Secondary | ICD-10-CM | POA: Diagnosis not present

## 2023-12-07 DIAGNOSIS — E1169 Type 2 diabetes mellitus with other specified complication: Secondary | ICD-10-CM | POA: Diagnosis not present

## 2023-12-15 DIAGNOSIS — D225 Melanocytic nevi of trunk: Secondary | ICD-10-CM | POA: Diagnosis not present

## 2023-12-15 DIAGNOSIS — L820 Inflamed seborrheic keratosis: Secondary | ICD-10-CM | POA: Diagnosis not present

## 2023-12-15 DIAGNOSIS — L739 Follicular disorder, unspecified: Secondary | ICD-10-CM | POA: Diagnosis not present

## 2023-12-15 DIAGNOSIS — L821 Other seborrheic keratosis: Secondary | ICD-10-CM | POA: Diagnosis not present

## 2023-12-15 DIAGNOSIS — D485 Neoplasm of uncertain behavior of skin: Secondary | ICD-10-CM | POA: Diagnosis not present

## 2023-12-15 DIAGNOSIS — D2239 Melanocytic nevi of other parts of face: Secondary | ICD-10-CM | POA: Diagnosis not present

## 2024-05-25 ENCOUNTER — Inpatient Hospital Stay

## 2024-05-25 ENCOUNTER — Inpatient Hospital Stay: Admitting: Oncology
